# Patient Record
Sex: Male | Born: 1965 | Race: White | Hispanic: No | Smoking: Current every day smoker
Health system: Southern US, Community
[De-identification: ages and names within clinical notes are randomized; demographics above are authoritative.]

## PROBLEM LIST (undated history)

## (undated) DIAGNOSIS — K5090 Crohn's disease, unspecified, without complications: Secondary | ICD-10-CM

## (undated) DIAGNOSIS — I639 Cerebral infarction, unspecified: Secondary | ICD-10-CM

## (undated) DIAGNOSIS — C20 Malignant neoplasm of rectum: Secondary | ICD-10-CM

## (undated) DIAGNOSIS — L409 Psoriasis, unspecified: Secondary | ICD-10-CM

## (undated) HISTORY — DX: Psoriasis, unspecified: L40.9

## (undated) HISTORY — PX: OTHER SURGICAL HISTORY: SHX169

## (undated) HISTORY — PX: KNEE SURGERY: SHX244

## (undated) HISTORY — DX: Malignant neoplasm of rectum: C20

## (undated) HISTORY — DX: Crohn's disease, unspecified, without complications: K50.90

## (undated) HISTORY — DX: Cerebral infarction, unspecified: I63.9

---

## 2004-04-24 ENCOUNTER — Ambulatory Visit: Payer: Self-pay | Admitting: Internal Medicine

## 2004-06-19 ENCOUNTER — Ambulatory Visit: Payer: Self-pay | Admitting: Internal Medicine

## 2008-08-29 ENCOUNTER — Ambulatory Visit: Payer: Self-pay | Admitting: Cardiology

## 2008-09-26 ENCOUNTER — Ambulatory Visit: Payer: Self-pay | Admitting: Cardiology

## 2009-03-31 ENCOUNTER — Ambulatory Visit (HOSPITAL_COMMUNITY): Admission: RE | Admit: 2009-03-31 | Discharge: 2009-03-31 | Payer: Self-pay | Admitting: Cardiovascular Disease

## 2011-05-16 DIAGNOSIS — J0100 Acute maxillary sinusitis, unspecified: Secondary | ICD-10-CM | POA: Diagnosis not present

## 2011-05-16 DIAGNOSIS — J209 Acute bronchitis, unspecified: Secondary | ICD-10-CM | POA: Diagnosis not present

## 2011-06-04 DIAGNOSIS — J209 Acute bronchitis, unspecified: Secondary | ICD-10-CM | POA: Diagnosis not present

## 2011-08-30 DIAGNOSIS — J209 Acute bronchitis, unspecified: Secondary | ICD-10-CM | POA: Diagnosis not present

## 2011-08-30 DIAGNOSIS — L723 Sebaceous cyst: Secondary | ICD-10-CM | POA: Diagnosis not present

## 2012-01-16 DIAGNOSIS — L408 Other psoriasis: Secondary | ICD-10-CM | POA: Diagnosis not present

## 2012-02-20 DIAGNOSIS — L408 Other psoriasis: Secondary | ICD-10-CM | POA: Diagnosis not present

## 2012-03-26 DIAGNOSIS — R1032 Left lower quadrant pain: Secondary | ICD-10-CM | POA: Diagnosis not present

## 2012-03-26 DIAGNOSIS — J0100 Acute maxillary sinusitis, unspecified: Secondary | ICD-10-CM | POA: Diagnosis not present

## 2012-08-19 DIAGNOSIS — I10 Essential (primary) hypertension: Secondary | ICD-10-CM | POA: Diagnosis not present

## 2012-08-19 DIAGNOSIS — L02519 Cutaneous abscess of unspecified hand: Secondary | ICD-10-CM | POA: Diagnosis not present

## 2012-08-19 DIAGNOSIS — Z79899 Other long term (current) drug therapy: Secondary | ICD-10-CM | POA: Diagnosis not present

## 2012-08-19 DIAGNOSIS — L03119 Cellulitis of unspecified part of limb: Secondary | ICD-10-CM | POA: Diagnosis not present

## 2012-08-19 DIAGNOSIS — Z0000 Encounter for general adult medical examination without abnormal findings: Secondary | ICD-10-CM | POA: Diagnosis not present

## 2012-08-19 DIAGNOSIS — E7800 Pure hypercholesterolemia, unspecified: Secondary | ICD-10-CM | POA: Diagnosis not present

## 2012-08-19 DIAGNOSIS — N400 Enlarged prostate without lower urinary tract symptoms: Secondary | ICD-10-CM | POA: Diagnosis not present

## 2012-08-19 DIAGNOSIS — L723 Sebaceous cyst: Secondary | ICD-10-CM | POA: Diagnosis not present

## 2012-08-20 DIAGNOSIS — R300 Dysuria: Secondary | ICD-10-CM | POA: Diagnosis not present

## 2012-09-09 DIAGNOSIS — M5430 Sciatica, unspecified side: Secondary | ICD-10-CM | POA: Diagnosis not present

## 2012-12-18 DIAGNOSIS — E7800 Pure hypercholesterolemia, unspecified: Secondary | ICD-10-CM | POA: Diagnosis not present

## 2012-12-18 DIAGNOSIS — I10 Essential (primary) hypertension: Secondary | ICD-10-CM | POA: Diagnosis not present

## 2012-12-18 DIAGNOSIS — N400 Enlarged prostate without lower urinary tract symptoms: Secondary | ICD-10-CM | POA: Diagnosis not present

## 2012-12-18 DIAGNOSIS — L723 Sebaceous cyst: Secondary | ICD-10-CM | POA: Diagnosis not present

## 2012-12-30 DIAGNOSIS — R300 Dysuria: Secondary | ICD-10-CM | POA: Diagnosis not present

## 2012-12-30 DIAGNOSIS — L723 Sebaceous cyst: Secondary | ICD-10-CM | POA: Diagnosis not present

## 2012-12-30 DIAGNOSIS — R351 Nocturia: Secondary | ICD-10-CM | POA: Diagnosis not present

## 2012-12-30 DIAGNOSIS — R39198 Other difficulties with micturition: Secondary | ICD-10-CM | POA: Diagnosis not present

## 2013-01-14 DIAGNOSIS — Z79899 Other long term (current) drug therapy: Secondary | ICD-10-CM | POA: Diagnosis not present

## 2013-01-14 DIAGNOSIS — F17200 Nicotine dependence, unspecified, uncomplicated: Secondary | ICD-10-CM | POA: Diagnosis not present

## 2013-01-14 DIAGNOSIS — R351 Nocturia: Secondary | ICD-10-CM | POA: Diagnosis not present

## 2013-01-14 DIAGNOSIS — R300 Dysuria: Secondary | ICD-10-CM | POA: Diagnosis not present

## 2013-01-14 DIAGNOSIS — Z883 Allergy status to other anti-infective agents status: Secondary | ICD-10-CM | POA: Diagnosis not present

## 2013-01-14 DIAGNOSIS — M4120 Other idiopathic scoliosis, site unspecified: Secondary | ICD-10-CM | POA: Diagnosis not present

## 2013-01-14 DIAGNOSIS — R39198 Other difficulties with micturition: Secondary | ICD-10-CM | POA: Diagnosis not present

## 2013-01-14 DIAGNOSIS — Z85038 Personal history of other malignant neoplasm of large intestine: Secondary | ICD-10-CM | POA: Diagnosis not present

## 2013-01-14 DIAGNOSIS — N508 Other specified disorders of male genital organs: Secondary | ICD-10-CM | POA: Diagnosis not present

## 2013-01-14 DIAGNOSIS — L723 Sebaceous cyst: Secondary | ICD-10-CM | POA: Diagnosis not present

## 2013-01-14 DIAGNOSIS — I10 Essential (primary) hypertension: Secondary | ICD-10-CM | POA: Diagnosis not present

## 2013-01-14 DIAGNOSIS — K5900 Constipation, unspecified: Secondary | ICD-10-CM | POA: Diagnosis not present

## 2013-01-14 DIAGNOSIS — L408 Other psoriasis: Secondary | ICD-10-CM | POA: Diagnosis not present

## 2013-01-14 DIAGNOSIS — K219 Gastro-esophageal reflux disease without esophagitis: Secondary | ICD-10-CM | POA: Diagnosis not present

## 2013-01-14 DIAGNOSIS — M069 Rheumatoid arthritis, unspecified: Secondary | ICD-10-CM | POA: Diagnosis not present

## 2013-01-20 DIAGNOSIS — R39198 Other difficulties with micturition: Secondary | ICD-10-CM | POA: Diagnosis not present

## 2013-01-20 DIAGNOSIS — N3943 Post-void dribbling: Secondary | ICD-10-CM | POA: Diagnosis not present

## 2013-01-20 DIAGNOSIS — R351 Nocturia: Secondary | ICD-10-CM | POA: Diagnosis not present

## 2013-01-20 DIAGNOSIS — R300 Dysuria: Secondary | ICD-10-CM | POA: Diagnosis not present

## 2013-01-20 DIAGNOSIS — L723 Sebaceous cyst: Secondary | ICD-10-CM | POA: Diagnosis not present

## 2013-02-12 DIAGNOSIS — J0100 Acute maxillary sinusitis, unspecified: Secondary | ICD-10-CM | POA: Diagnosis not present

## 2013-02-12 DIAGNOSIS — J209 Acute bronchitis, unspecified: Secondary | ICD-10-CM | POA: Diagnosis not present

## 2013-02-23 DIAGNOSIS — L723 Sebaceous cyst: Secondary | ICD-10-CM | POA: Diagnosis not present

## 2013-02-23 DIAGNOSIS — R39198 Other difficulties with micturition: Secondary | ICD-10-CM | POA: Diagnosis not present

## 2013-02-23 DIAGNOSIS — R351 Nocturia: Secondary | ICD-10-CM | POA: Diagnosis not present

## 2013-02-23 DIAGNOSIS — N3943 Post-void dribbling: Secondary | ICD-10-CM | POA: Diagnosis not present

## 2013-04-13 DIAGNOSIS — E7800 Pure hypercholesterolemia, unspecified: Secondary | ICD-10-CM | POA: Diagnosis not present

## 2013-04-13 DIAGNOSIS — I10 Essential (primary) hypertension: Secondary | ICD-10-CM | POA: Diagnosis not present

## 2013-04-14 DIAGNOSIS — E7800 Pure hypercholesterolemia, unspecified: Secondary | ICD-10-CM | POA: Diagnosis not present

## 2013-04-20 DIAGNOSIS — M5430 Sciatica, unspecified side: Secondary | ICD-10-CM | POA: Diagnosis not present

## 2013-04-20 DIAGNOSIS — N400 Enlarged prostate without lower urinary tract symptoms: Secondary | ICD-10-CM | POA: Diagnosis not present

## 2013-04-20 DIAGNOSIS — E7800 Pure hypercholesterolemia, unspecified: Secondary | ICD-10-CM | POA: Diagnosis not present

## 2013-04-20 DIAGNOSIS — D490 Neoplasm of unspecified behavior of digestive system: Secondary | ICD-10-CM | POA: Diagnosis not present

## 2013-04-20 DIAGNOSIS — I10 Essential (primary) hypertension: Secondary | ICD-10-CM | POA: Diagnosis not present

## 2013-04-28 DIAGNOSIS — L989 Disorder of the skin and subcutaneous tissue, unspecified: Secondary | ICD-10-CM | POA: Diagnosis not present

## 2013-04-28 DIAGNOSIS — K130 Diseases of lips: Secondary | ICD-10-CM | POA: Diagnosis not present

## 2013-05-22 DIAGNOSIS — T887XXA Unspecified adverse effect of drug or medicament, initial encounter: Secondary | ICD-10-CM | POA: Diagnosis not present

## 2013-06-16 DIAGNOSIS — L089 Local infection of the skin and subcutaneous tissue, unspecified: Secondary | ICD-10-CM | POA: Diagnosis not present

## 2013-06-16 DIAGNOSIS — L723 Sebaceous cyst: Secondary | ICD-10-CM | POA: Diagnosis not present

## 2013-06-16 DIAGNOSIS — L989 Disorder of the skin and subcutaneous tissue, unspecified: Secondary | ICD-10-CM | POA: Diagnosis not present

## 2013-07-02 DIAGNOSIS — L723 Sebaceous cyst: Secondary | ICD-10-CM | POA: Diagnosis not present

## 2013-07-16 DIAGNOSIS — L408 Other psoriasis: Secondary | ICD-10-CM | POA: Diagnosis not present

## 2013-07-16 DIAGNOSIS — L723 Sebaceous cyst: Secondary | ICD-10-CM | POA: Diagnosis not present

## 2013-08-24 DIAGNOSIS — S30860A Insect bite (nonvenomous) of lower back and pelvis, initial encounter: Secondary | ICD-10-CM | POA: Diagnosis not present

## 2013-08-24 DIAGNOSIS — W57XXXA Bitten or stung by nonvenomous insect and other nonvenomous arthropods, initial encounter: Secondary | ICD-10-CM | POA: Diagnosis not present

## 2013-08-24 DIAGNOSIS — L408 Other psoriasis: Secondary | ICD-10-CM | POA: Diagnosis not present

## 2013-10-20 DIAGNOSIS — I10 Essential (primary) hypertension: Secondary | ICD-10-CM | POA: Diagnosis not present

## 2013-10-20 DIAGNOSIS — E7800 Pure hypercholesterolemia, unspecified: Secondary | ICD-10-CM | POA: Diagnosis not present

## 2013-12-01 DIAGNOSIS — I10 Essential (primary) hypertension: Secondary | ICD-10-CM | POA: Diagnosis not present

## 2013-12-01 DIAGNOSIS — N400 Enlarged prostate without lower urinary tract symptoms: Secondary | ICD-10-CM | POA: Diagnosis not present

## 2013-12-01 DIAGNOSIS — K2100 Gastro-esophageal reflux disease with esophagitis, without bleeding: Secondary | ICD-10-CM | POA: Diagnosis not present

## 2013-12-01 DIAGNOSIS — L408 Other psoriasis: Secondary | ICD-10-CM | POA: Diagnosis not present

## 2013-12-01 DIAGNOSIS — M5430 Sciatica, unspecified side: Secondary | ICD-10-CM | POA: Diagnosis not present

## 2013-12-01 DIAGNOSIS — E7800 Pure hypercholesterolemia, unspecified: Secondary | ICD-10-CM | POA: Diagnosis not present

## 2013-12-27 DIAGNOSIS — L408 Other psoriasis: Secondary | ICD-10-CM | POA: Diagnosis not present

## 2013-12-27 DIAGNOSIS — R39198 Other difficulties with micturition: Secondary | ICD-10-CM | POA: Diagnosis not present

## 2013-12-27 DIAGNOSIS — L723 Sebaceous cyst: Secondary | ICD-10-CM | POA: Diagnosis not present

## 2014-01-03 DIAGNOSIS — Z79899 Other long term (current) drug therapy: Secondary | ICD-10-CM | POA: Diagnosis not present

## 2014-01-03 DIAGNOSIS — L408 Other psoriasis: Secondary | ICD-10-CM | POA: Diagnosis not present

## 2014-01-03 DIAGNOSIS — L0100 Impetigo, unspecified: Secondary | ICD-10-CM | POA: Diagnosis not present

## 2014-03-21 DIAGNOSIS — J209 Acute bronchitis, unspecified: Secondary | ICD-10-CM | POA: Diagnosis not present

## 2014-03-21 DIAGNOSIS — J0101 Acute recurrent maxillary sinusitis: Secondary | ICD-10-CM | POA: Diagnosis not present

## 2014-03-30 DIAGNOSIS — I10 Essential (primary) hypertension: Secondary | ICD-10-CM | POA: Diagnosis not present

## 2014-03-30 DIAGNOSIS — E780 Pure hypercholesterolemia: Secondary | ICD-10-CM | POA: Diagnosis not present

## 2014-03-31 DIAGNOSIS — J209 Acute bronchitis, unspecified: Secondary | ICD-10-CM | POA: Diagnosis not present

## 2014-03-31 DIAGNOSIS — I10 Essential (primary) hypertension: Secondary | ICD-10-CM | POA: Diagnosis not present

## 2014-03-31 DIAGNOSIS — E780 Pure hypercholesterolemia: Secondary | ICD-10-CM | POA: Diagnosis not present

## 2014-08-01 DIAGNOSIS — L723 Sebaceous cyst: Secondary | ICD-10-CM | POA: Diagnosis not present

## 2014-08-01 DIAGNOSIS — L291 Pruritus scroti: Secondary | ICD-10-CM | POA: Diagnosis not present

## 2014-08-01 DIAGNOSIS — R3919 Other difficulties with micturition: Secondary | ICD-10-CM | POA: Diagnosis not present

## 2014-09-22 DIAGNOSIS — M791 Myalgia: Secondary | ICD-10-CM | POA: Diagnosis not present

## 2014-09-22 DIAGNOSIS — I10 Essential (primary) hypertension: Secondary | ICD-10-CM | POA: Diagnosis not present

## 2014-09-22 DIAGNOSIS — L409 Psoriasis, unspecified: Secondary | ICD-10-CM | POA: Diagnosis not present

## 2014-09-22 DIAGNOSIS — Z1389 Encounter for screening for other disorder: Secondary | ICD-10-CM | POA: Diagnosis not present

## 2014-09-22 DIAGNOSIS — E780 Pure hypercholesterolemia: Secondary | ICD-10-CM | POA: Diagnosis not present

## 2014-10-27 DIAGNOSIS — E780 Pure hypercholesterolemia: Secondary | ICD-10-CM | POA: Diagnosis not present

## 2014-10-27 DIAGNOSIS — I10 Essential (primary) hypertension: Secondary | ICD-10-CM | POA: Diagnosis not present

## 2014-11-02 DIAGNOSIS — Z720 Tobacco use: Secondary | ICD-10-CM | POA: Diagnosis not present

## 2014-11-02 DIAGNOSIS — E780 Pure hypercholesterolemia: Secondary | ICD-10-CM | POA: Diagnosis not present

## 2014-11-02 DIAGNOSIS — I10 Essential (primary) hypertension: Secondary | ICD-10-CM | POA: Diagnosis not present

## 2014-11-02 DIAGNOSIS — L409 Psoriasis, unspecified: Secondary | ICD-10-CM | POA: Diagnosis not present

## 2014-11-02 DIAGNOSIS — M791 Myalgia: Secondary | ICD-10-CM | POA: Diagnosis not present

## 2014-11-02 DIAGNOSIS — R7301 Impaired fasting glucose: Secondary | ICD-10-CM | POA: Diagnosis not present

## 2015-02-15 DIAGNOSIS — E7800 Pure hypercholesterolemia, unspecified: Secondary | ICD-10-CM | POA: Diagnosis not present

## 2015-02-15 DIAGNOSIS — I10 Essential (primary) hypertension: Secondary | ICD-10-CM | POA: Diagnosis not present

## 2015-02-22 DIAGNOSIS — I10 Essential (primary) hypertension: Secondary | ICD-10-CM | POA: Diagnosis not present

## 2015-02-22 DIAGNOSIS — M791 Myalgia: Secondary | ICD-10-CM | POA: Diagnosis not present

## 2015-02-22 DIAGNOSIS — L409 Psoriasis, unspecified: Secondary | ICD-10-CM | POA: Diagnosis not present

## 2015-02-22 DIAGNOSIS — R7301 Impaired fasting glucose: Secondary | ICD-10-CM | POA: Diagnosis not present

## 2015-03-28 DIAGNOSIS — L049 Acute lymphadenitis, unspecified: Secondary | ICD-10-CM | POA: Diagnosis not present

## 2015-05-19 DIAGNOSIS — J209 Acute bronchitis, unspecified: Secondary | ICD-10-CM | POA: Diagnosis not present

## 2015-05-19 DIAGNOSIS — J0101 Acute recurrent maxillary sinusitis: Secondary | ICD-10-CM | POA: Diagnosis not present

## 2015-06-02 DIAGNOSIS — J0101 Acute recurrent maxillary sinusitis: Secondary | ICD-10-CM | POA: Diagnosis not present

## 2015-06-02 DIAGNOSIS — E039 Hypothyroidism, unspecified: Secondary | ICD-10-CM | POA: Diagnosis not present

## 2015-06-02 DIAGNOSIS — J209 Acute bronchitis, unspecified: Secondary | ICD-10-CM | POA: Diagnosis not present

## 2015-06-02 DIAGNOSIS — R42 Dizziness and giddiness: Secondary | ICD-10-CM | POA: Diagnosis not present

## 2015-06-07 DIAGNOSIS — I638 Other cerebral infarction: Secondary | ICD-10-CM | POA: Diagnosis not present

## 2015-06-07 DIAGNOSIS — R509 Fever, unspecified: Secondary | ICD-10-CM | POA: Diagnosis not present

## 2015-06-07 DIAGNOSIS — R112 Nausea with vomiting, unspecified: Secondary | ICD-10-CM | POA: Diagnosis not present

## 2015-06-07 DIAGNOSIS — H538 Other visual disturbances: Secondary | ICD-10-CM | POA: Diagnosis not present

## 2015-06-07 DIAGNOSIS — R05 Cough: Secondary | ICD-10-CM | POA: Diagnosis not present

## 2015-06-09 DIAGNOSIS — R42 Dizziness and giddiness: Secondary | ICD-10-CM | POA: Diagnosis not present

## 2015-06-09 DIAGNOSIS — I69398 Other sequelae of cerebral infarction: Secondary | ICD-10-CM | POA: Diagnosis not present

## 2015-06-12 DIAGNOSIS — R42 Dizziness and giddiness: Secondary | ICD-10-CM | POA: Diagnosis not present

## 2015-06-12 DIAGNOSIS — I639 Cerebral infarction, unspecified: Secondary | ICD-10-CM | POA: Diagnosis not present

## 2015-06-13 ENCOUNTER — Ambulatory Visit (INDEPENDENT_AMBULATORY_CARE_PROVIDER_SITE_OTHER): Payer: Medicare Other | Admitting: Neurology

## 2015-06-13 ENCOUNTER — Encounter: Payer: Self-pay | Admitting: Neurology

## 2015-06-13 VITALS — BP 163/102 | HR 70 | Ht 68.0 in | Wt 162.0 lb

## 2015-06-13 DIAGNOSIS — I639 Cerebral infarction, unspecified: Secondary | ICD-10-CM | POA: Diagnosis not present

## 2015-06-13 DIAGNOSIS — R799 Abnormal finding of blood chemistry, unspecified: Secondary | ICD-10-CM | POA: Diagnosis not present

## 2015-06-13 DIAGNOSIS — R42 Dizziness and giddiness: Secondary | ICD-10-CM | POA: Diagnosis not present

## 2015-06-13 DIAGNOSIS — I10 Essential (primary) hypertension: Secondary | ICD-10-CM | POA: Diagnosis not present

## 2015-06-13 MED ORDER — AMLODIPINE BESYLATE 5 MG PO TABS: 5.0000 mg | ORAL_TABLET | Freq: Every day | ORAL | Status: DC

## 2015-06-13 NOTE — Progress Notes (Signed)
PATIENT: Michael Levine DOB: 06/10/1965  Chief Complaint  Patient presents with  . Cerebrovascular Accident    He is here with his mother, Romie Minus.  He went to his primary care doctor for intermittent dizzy spells.  He underwent scanning and multiple strokes were discovered.  He is still having left-sided weakness, mild slurred speech and headaches.     HISTORICAL  Michael Levine is a 50 years old right-handed male, accompanied by his mother [name], seen in refer by his primary care physician Dr. Consuello Masse for evaluation of stroke  He had a history of Crohn's disease, it is currently under good control, rectal cancer in 1995, he had recurrent rectal cancer, required multiple surgery, followed by chemotherapy and radiation therapy, psoriasis  In May 07 2015, he had onset vertigo, unsteady gait lasted for 30 minutes, went he went to baseline quickly, in December 30 first 2016, he had second episode of sudden onset dizziness, this time is prolonged lasting for a few days with associated nausea or vomiting, unsteady gait, blurry vision, he has severe nausea to the point of difficulty holding down the water, eventually was evaluated by his primary care physician Dr. Quintin Alto.  I reviewed MRI of the brain without contrast in June 07 2015 at [facility] diagnostic imaging, there is a mix of subacute and chronic infarction in the right cerebellum, suggesting right vertebral artery thromboembolic disease, no associated hemorrhagic or mass effect,   Laboratory evaluation June 02 2015, normal CMP, creatinine 0.9, normal CBC, hemoglobin 16 point 1, normal TSH,  CT angiogram of neck in June 12 2015, widely patent bilateral carotid, vertebral artery without evidence of significant stenosis   Over the past few weeks, he had marked recovery, but continue feel intermittent vertigo, dizziness, clumsiness of both hands, especially left arm, mild unsteady gait, intermittent chest discomfort, pressure  sensation, no palpitation noticed   REVIEW OF SYSTEMS: Full 14 system review of systems performed and notable only for chest pain, blurred vision, double vision, loss of vision, eye pain, cough, joint pain, cramps, achy muscles, headaches, weakness, slurred speech, dizziness, sleepiness, disinterested in activities ALLERGIES: Allergies  Allergen Reactions  . Keflex [Cephalexin] Anaphylaxis    HOME MEDICATIONS: Current Outpatient Prescriptions  Medication Sig Dispense Refill  . Acetaminophen (TYLENOL PO) Take by mouth as needed.    Marland Kitchen aspirin 81 MG tablet Take 81 mg by mouth daily.    . clobetasol (TEMOVATE) 0.05 % external solution     . omeprazole (PRILOSEC) 20 MG capsule     . PROCTO-MED HC 2.5 % rectal cream     . ranitidine (ZANTAC) 150 MG tablet      No current facility-administered medications for this visit.    PAST MEDICAL HISTORY: Past Medical History  Diagnosis Date  . Crohn's disease ([place])   . Rectum cancer ([place])   . Psoriasis   . CVA (cerebral infarction)     PAST SURGICAL HISTORY: Past Surgical History  Procedure Laterality Date  . Rectum surgery      x 3  . Knee surgery Right     FAMILY HISTORY: Family History  Problem Relation Age of Onset  . Heart disease Father   . Diabetes Father   . Hypertension Mother   . Fibromyalgia Mother     SOCIAL HISTORY:  Social History   Social History  . Marital Status: Divorced    Spouse Name: N/A  . Number of Children: 2  . Years of Education: 9th grade  Occupational History  . Disabled    Social History Main Topics  . Smoking status: Current Every Day Smoker -- 5.00 packs/day    Types: Cigarettes  . Smokeless tobacco: Not on file  . Alcohol Use: No  . Drug Use: No  . Sexual Activity: Not on file   Other Topics Concern  . Not on file   Social History Narrative   Lives at home alone.   Right-handed.   8-10 sodas per day.     PHYSICAL EXAM   Filed Vitals:   06/13/15 1313  BP: 163/102    Pulse: 70  Height: 5\' 8"  (1.727 m)  Weight: 162 lb (73.483 kg)    Not recorded      Body mass index is 24.64 kg/(m^2).  PHYSICAL EXAMNIATION:  Gen: NAD, conversant, well nourised, obese, well groomed                     Cardiovascular: Regular rate rhythm, no peripheral edema, warm, nontender. Eyes: Conjunctivae clear without exudates or hemorrhage Neck: Supple, no carotid bruise. Pulmonary: Clear to auscultation bilaterally   NEUROLOGICAL EXAM:  MENTAL STATUS: Speech:    Speech is normal; fluent and spontaneous with normal comprehension.  Cognition:     Orientation to time, place and person     Normal recent and remote memory     Normal Attention span and concentration     Normal Language, naming, repeating,spontaneous speech     Fund of knowledge   CRANIAL NERVES: CN II: Visual fields are full to confrontation. Fundoscopic exam is normal with sharp discs and no vascular changes. Pupils are round equal and briskly reactive to light. CN III, IV, VI: extraocular movement are normal. No ptosis. CN V: Facial sensation is intact to pinprick in all 3 divisions bilaterally. Corneal responses are intact.  CN VII: Face is symmetric with normal eye closure and smile. CN VIII: Hearing is normal to rubbing fingers CN IX, X: Palate elevates symmetrically. Phonation is normal. CN XI: Head turning and shoulder shrug are intact CN XII: Tongue is midline with normal movements and no atrophy.  MOTOR: There is no pronator drift of out-stretched arms. Muscle bulk and tone are normal. Mild fixation of left upper extremity upon rapid rotating movement  REFLEXES: Reflexes are 2+ and symmetric at the biceps, triceps, knees, and ankles. Plantar responses are flexor.  SENSORY: Intact to light touch, pinprick, position sense, and vibration sense are intact in fingers and toes.  COORDINATION: Rapid alternating movements and fine finger movements are intact. He has mild left finger to nose  dysmetria  GAIT/STANCE: Posture is normal. Gait is steady with normal steps, base, arm swing, and turning. Heel and toe walking are normal. Mild difficulty with tandem walking Romberg is absent.   DIAGNOSTIC DATA (LABS, IMAGING, TESTING) - I reviewed patient records, labs, notes, testing and imaging myself where available.   ASSESSMENT AND PLAN  RAWLAND SERINO is a 50 y.o. male    50 years old with history of acute stroke at right cerebellum in December 2016.  He has vascular risk factor of HTN, today's bp is elevated, will add on amlodipine 5 mg daily  Daily aspirin keep well hydration moderate exercise  Complete evaluation with echocardiogram, EKG  Laboratory evaluation today including inflammatory markers, hypocoagulable status,   Marcial Pacas, M.D. Ph.D.  [facility] [address], [place], [place] [address] Ph: [phone] Fax: [phone]  [id number] Hilda Blades, MD

## 2015-06-21 ENCOUNTER — Telehealth: Payer: Self-pay | Admitting: Neurology

## 2015-06-21 ENCOUNTER — Encounter: Payer: Self-pay | Admitting: *Deleted

## 2015-06-21 DIAGNOSIS — R7301 Impaired fasting glucose: Secondary | ICD-10-CM | POA: Diagnosis not present

## 2015-06-21 DIAGNOSIS — E7800 Pure hypercholesterolemia, unspecified: Secondary | ICD-10-CM | POA: Diagnosis not present

## 2015-06-21 DIAGNOSIS — I10 Essential (primary) hypertension: Secondary | ICD-10-CM | POA: Diagnosis not present

## 2015-06-21 LAB — HYPERCOAGULABLE PANEL, COMPREHENSIVE
ACT. PRT C RESIST W/FV DEFIC.: 2.7 ratio
APTT: 29.7 s
AT III ACT/NOR PPP CHRO: 133 %
Anticardiolipin Ab, IgG: <10 [GPL'U]
Beta-2 Glycoprotein I, IgG: <10 SGU
DRVVT Confirm Seconds: 36 s
DRVVT RATIO: 1.4 ratio — AB
DRVVT Screen Seconds: 56.3 s — ABNORMAL HIGH
FACTOR VIII ACTIVITY: 124 %
Factor VII Antigen**: 98 %
HOMOCYSTEINE: 17.2 umol/L — AB
Hexagonal Phospholipid Neutral: 12 s — ABNORMAL HIGH
PROTEIN C AG/FVII AG RATIO: 1 ratio
Prot C Ag Act/Nor PPP Imm: 99 %
Prot S Ag Act/Nor PPP Imm: 132 %
Protein S Ag/FVII Ag Ratio**: 1.3 ratio

## 2015-06-21 LAB — C-REACTIVE PROTEIN: CRP: 20 mg/L — ABNORMAL HIGH (ref 0.0–4.9)

## 2015-06-21 LAB — SEDIMENTATION RATE: Sed Rate: 11 mm/hr (ref 0–30)

## 2015-06-21 LAB — VITAMIN B12: Vitamin B-12: 218 pg/mL (ref 211–946)

## 2015-06-21 LAB — CK: Total CK: 86 U/L (ref 24–204)

## 2015-06-21 LAB — RPR: RPR Ser Ql: NONREACTIVE

## 2015-06-21 LAB — FOLATE: Folate: 6.5 ng/mL (ref >3.0)

## 2015-06-21 LAB — ANA W/REFLEX IF POSITIVE: ANA: NEGATIVE

## 2015-06-21 NOTE — Telephone Encounter (Signed)
Patient aware of results and agreeable to supplements.  He wrote down the information to make sure he did not forget the dosages.

## 2015-06-21 NOTE — Telephone Encounter (Signed)
Please call patient, laboratory evaluation showed low normal B12, elevated homocystine level, elevated C reactive protein  He should take over-the-counter B12 supplement, [id number] g daily, folic acid 1 mg daily, keep daily aspirin

## 2015-06-27 ENCOUNTER — Ambulatory Visit (HOSPITAL_COMMUNITY): Payer: Medicare Other | Attending: Cardiology

## 2015-06-27 ENCOUNTER — Other Ambulatory Visit: Payer: Self-pay

## 2015-06-27 DIAGNOSIS — R42 Dizziness and giddiness: Secondary | ICD-10-CM | POA: Diagnosis not present

## 2015-06-27 DIAGNOSIS — I639 Cerebral infarction, unspecified: Secondary | ICD-10-CM | POA: Insufficient documentation

## 2015-06-27 DIAGNOSIS — I071 Rheumatic tricuspid insufficiency: Secondary | ICD-10-CM | POA: Diagnosis not present

## 2015-06-27 DIAGNOSIS — I340 Nonrheumatic mitral (valve) insufficiency: Secondary | ICD-10-CM | POA: Diagnosis not present

## 2015-06-27 DIAGNOSIS — I5189 Other ill-defined heart diseases: Secondary | ICD-10-CM | POA: Insufficient documentation

## 2015-06-28 ENCOUNTER — Telehealth: Payer: Self-pay | Admitting: Neurology

## 2015-06-28 NOTE — Telephone Encounter (Signed)
Please call patient Echocardiogram showed - Mild to moderate global reduction in LV function (EF 45); grade 1 diastolic dysfunction; trace MR; mild TR.  Keep current treatment plan

## 2015-06-28 NOTE — Telephone Encounter (Signed)
Patient aware of results.  He will continue his treatment plan and keep his follow up appointment.

## 2015-07-03 DIAGNOSIS — R21 Rash and other nonspecific skin eruption: Secondary | ICD-10-CM | POA: Diagnosis not present

## 2015-07-03 DIAGNOSIS — I10 Essential (primary) hypertension: Secondary | ICD-10-CM | POA: Diagnosis not present

## 2015-07-03 DIAGNOSIS — M791 Myalgia: Secondary | ICD-10-CM | POA: Diagnosis not present

## 2015-07-03 DIAGNOSIS — L409 Psoriasis, unspecified: Secondary | ICD-10-CM | POA: Diagnosis not present

## 2015-07-03 DIAGNOSIS — R7301 Impaired fasting glucose: Secondary | ICD-10-CM | POA: Diagnosis not present

## 2015-07-03 DIAGNOSIS — I639 Cerebral infarction, unspecified: Secondary | ICD-10-CM | POA: Diagnosis not present

## 2015-08-01 DIAGNOSIS — L723 Sebaceous cyst: Secondary | ICD-10-CM | POA: Diagnosis not present

## 2015-08-01 DIAGNOSIS — N319 Neuromuscular dysfunction of bladder, unspecified: Secondary | ICD-10-CM | POA: Diagnosis not present

## 2015-08-14 ENCOUNTER — Ambulatory Visit (INDEPENDENT_AMBULATORY_CARE_PROVIDER_SITE_OTHER): Payer: Medicare Other | Admitting: Neurology

## 2015-08-14 ENCOUNTER — Encounter: Payer: Self-pay | Admitting: Neurology

## 2015-08-14 VITALS — BP 151/89 | HR 75 | Ht 68.0 in | Wt 161.2 lb

## 2015-08-14 DIAGNOSIS — I639 Cerebral infarction, unspecified: Secondary | ICD-10-CM

## 2015-08-14 DIAGNOSIS — I10 Essential (primary) hypertension: Secondary | ICD-10-CM

## 2015-08-14 DIAGNOSIS — R9430 Abnormal result of cardiovascular function study, unspecified: Secondary | ICD-10-CM

## 2015-08-14 MED ORDER — DULOXETINE HCL 60 MG PO CPEP: 60.0000 mg | ORAL_CAPSULE | Freq: Every day | ORAL | Status: AC

## 2015-08-14 NOTE — Progress Notes (Signed)
Chief Complaint  Patient presents with  . Cerebrovascular Accident    He would like to review his Echo and labs.  Says he is still having upper extremity weakness but feels his symptoms have been improving slowly.        PATIENT: Michael Levine DOB: 1965/06/29  Chief Complaint  Patient presents with  . Cerebrovascular Accident    He would like to review his Echo and labs.  Says he is still having upper extremity weakness but feels his symptoms have been improving slowly.       HISTORICAL  Michael Levine is a 50 years old right-handed male, accompanied by his mother Michael Levine, seen in refer by his primary care physician Dr. Consuello Masse for evaluation of stroke in Jun 13 2015.  He had a history of Crohn's disease, it is currently under good control, rectal cancer in 1995, he had recurrent rectal cancer, required multiple surgery, followed by chemotherapy and radiation therapy, psoriasis  In May 07 2015, he had onset vertigo, unsteady gait lasted for 30 minutes, then he went back to baseline quickly, in December 31st 2016, he had second episode of sudden onset dizziness, this time is prolonged, lasting for a few days with associated nausea or vomiting, unsteady gait, blurry vision, he has severe nausea to the point of difficulty holding down the water, eventually was evaluated by his primary care physician Dr. Quintin Alto.  I reviewed MRI of the brain without contrast in June 07 2015 at [facility] diagnostic imaging, there is a mix of subacute and chronic infarction in the right cerebellum, suggesting right vertebral artery thromboembolic disease, no associated hemorrhagic or mass effect,   Laboratory evaluation June 02 2015, normal CMP, creatinine 0.9, normal CBC, hemoglobin 16 point 1, normal TSH,  CT angiogram of neck in June 12 2015, widely patent bilateral carotid, vertebral artery without evidence of significant stenosis   Over the past few weeks, he had marked recovery, but  continue feel intermittent vertigo, dizziness, clumsiness of both hands, especially left arm, mild unsteady gait, intermittent chest discomfort, pressure sensation, no palpitation noticed  UPDATE August 14 2015: He has no recurrent strokelike symptoms, still has mild unsteady gait, bilateral hands clumsiness, echocardiogram in February showed ejection fraction 40-45%, diffuse hypokinesia is, he denied history of chronic artery disease, denied chest pain, he has been taking aspirin daily,  Laboratory evaluation showed elevated C reactive protein of 20, otherwise normal ESR, low normal B12 218, mild elevated homocystine level 12.2, normal CPK, TSH,  he has been taking B12 supplement, no significant abnormality on hypercoagulable profile.  He complains of worsening psoriasis skin lesions, which involving bilateral thighs, he also complains of generalized muscle achy pain, joints pain, difficulty raising arm overhead,  She has significant neck pain, radiating pain to bilateral shoulder  REVIEW OF SYSTEMS: Full 14 system review of systems performed and notable only for blurred vision, wheezing, restless leg, joint pain, achy muscles, muscle cramps, neck pain, neck stiffness, dizziness, numbness, speech difficulty, weakness   Allergies  Allergen Reactions  . Keflex [Cephalexin] Anaphylaxis  . Augmentin [Amoxicillin-Pot Clavulanate] Other (See Comments)    diarrhea    HOME MEDICATIONS: Current Outpatient Prescriptions  Medication Sig Dispense Refill  . amLODipine (NORVASC) 5 MG tablet Take 1 tablet (5 mg total) by mouth daily. 30 tablet 11  . aspirin 81 MG tablet Take 81 mg by mouth daily.    Marland Kitchen atorvastatin (LIPITOR) 10 MG tablet     . clobetasol (TEMOVATE) 0.05 % external  solution     . clobetasol ointment (TEMOVATE) 0.05 %     . folic acid (FOLVITE) 1 MG tablet Take 1 mg by mouth daily.    Marland Kitchen omeprazole (PRILOSEC) 20 MG capsule     . PROCTO-MED HC 2.5 % rectal cream     . ranitidine (ZANTAC)  150 MG tablet     . tamsulosin (FLOMAX) 0.4 MG CAPS capsule daily.    . vitamin B-12 (CYANOCOBALAMIN) 1000 MCG tablet Take 1,000 mcg by mouth daily.     No current facility-administered medications for this visit.    PAST MEDICAL HISTORY: Past Medical History  Diagnosis Date  . Crohn's disease ([place])   . Rectum cancer ([place])   . Psoriasis   . CVA (cerebral infarction)     PAST SURGICAL HISTORY: Past Surgical History  Procedure Laterality Date  . Rectum surgery      x 3  . Knee surgery Right     FAMILY HISTORY: Family History  Problem Relation Age of Onset  . Heart disease Father   . Diabetes Father   . Hypertension Mother   . Fibromyalgia Mother     SOCIAL HISTORY:  Social History   Social History  . Marital Status: Divorced    Spouse Name: N/A  . Number of Children: 2  . Years of Education: 9th grade   Occupational History  . Disabled    Social History Main Topics  . Smoking status: Current Every Day Smoker -- 5.00 packs/day    Types: Cigarettes  . Smokeless tobacco: Not on file  . Alcohol Use: No  . Drug Use: No  . Sexual Activity: Not on file   Other Topics Concern  . Not on file   Social History Narrative   Lives at home alone.   Right-handed.   8-10 sodas per day.     PHYSICAL EXAM   Filed Vitals:   08/14/15 1423  BP: 151/89  Pulse: 75  Height: _0  (1.727 m)  Weight: 161 lb 4 oz (73.143 kg)    Not recorded      Body mass index is 24.52 kg/(m^2).  PHYSICAL EXAMNIATION:  Gen: NAD, conversant, well nourised, obese, well groomed                     Cardiovascular: Regular rate rhythm, no peripheral edema, warm, nontender. Eyes: Conjunctivae clear without exudates or hemorrhage Neck: Supple, no carotid bruise. Pulmonary: Clear to auscultation bilaterally Skin: he has diffuse psoriasis lesions at bilateral thigh   NEUROLOGICAL EXAM:  MENTAL STATUS: Speech:    Speech is normal; fluent and spontaneous with normal comprehension.    Cognition:     Orientation to time, place and person     Normal recent and remote memory     Normal Attention span and concentration     Normal Language, naming, repeating,spontaneous speech     Fund of knowledge   CRANIAL NERVES: CN II: Visual fields are full to confrontation. Fundoscopic exam is normal with sharp discs and no vascular changes. Pupils are round equal and briskly reactive to light. CN III, IV, VI: extraocular movement are normal. No ptosis. CN V: Facial sensation is intact to pinprick in all 3 divisions bilaterally. Corneal responses are intact.  CN VII: Face is symmetric with normal eye closure and smile. CN VIII: Hearing is normal to rubbing fingers CN IX, X: Palate elevates symmetrically. Phonation is normal. CN XI: Head turning and shoulder shrug are intact CN XII:  Tongue is midline with normal movements and no atrophy.  MOTOR: There is no pronator drift of out-stretched arms. Muscle bulk and tone are normal. Mild fixation of left upper extremity upon rapid rotating movement  REFLEXES: Reflexes are 3 and symmetric at the biceps, triceps, knees, and ankles. Plantar responses are flexor.  SENSORY: Intact to light touch, pinprick, position sense, and vibration sense are intact in fingers and toes.  COORDINATION: Rapid alternating movements and fine finger movements are intact. He has mild left finger to nose dysmetria  GAIT/STANCE: Posture is normal. Gait is steady with normal steps, base, arm swing, and turning. Heel and toe walking are normal. Mild difficulty with tandem walking Romberg is absent.   DIAGNOSTIC DATA (LABS, IMAGING, TESTING) - I reviewed patient records, labs, notes, testing and imaging myself where available.   ASSESSMENT AND PLAN  Michael Levine is a 50 y.o. male    50 years old with history of acute stroke at right cerebellum in December 2016.  Continue daily aspirin 81 mg daily  Abnormal echocardiogram, diffuse hypokinesis, with  exception fraction 40-45%  Refer him to cardiologist for evaluation  Polymyalgia, elevated C reactive protein  Repeat C-reactive protein  Add on Cymbalta 60 mg daily  Neck pain  Hyperreflexia on examinations, need to rule out cervical spondylitic myelopathy, proceed with MRI of cervical spine  Marcial Pacas, M.D. Ph.D.  [facility] [address], [place], [place] [address] Ph: [phone] Fax: [phone]  BD:[id number] Hilda Blades, MD

## 2015-08-15 LAB — SEDIMENTATION RATE: Sed Rate: 2 mm/hr (ref 0–30)

## 2015-08-15 LAB — C-REACTIVE PROTEIN: CRP: 4.8 mg/L (ref 0.0–4.9)

## 2015-08-22 ENCOUNTER — Ambulatory Visit
Admission: RE | Admit: 2015-08-22 | Discharge: 2015-08-22 | Disposition: A | Discharge status: Home / Self Care | Payer: Medicare Other | Source: Ambulatory Visit | Attending: Neurology | Admitting: Neurology

## 2015-08-22 DIAGNOSIS — M50223 Other cervical disc displacement at C6-C7 level: Secondary | ICD-10-CM | POA: Diagnosis not present

## 2015-08-22 DIAGNOSIS — R9430 Abnormal result of cardiovascular function study, unspecified: Secondary | ICD-10-CM

## 2015-08-22 DIAGNOSIS — I639 Cerebral infarction, unspecified: Secondary | ICD-10-CM

## 2015-08-22 DIAGNOSIS — M50221 Other cervical disc displacement at C4-C5 level: Secondary | ICD-10-CM | POA: Diagnosis not present

## 2015-08-22 DIAGNOSIS — I10 Essential (primary) hypertension: Secondary | ICD-10-CM

## 2015-08-22 DIAGNOSIS — M50222 Other cervical disc displacement at C5-C6 level: Secondary | ICD-10-CM | POA: Diagnosis not present

## 2015-08-22 DIAGNOSIS — M47812 Spondylosis without myelopathy or radiculopathy, cervical region: Secondary | ICD-10-CM | POA: Diagnosis not present

## 2015-08-24 NOTE — Progress Notes (Signed)
Electrophysiology Office Note   Date:  08/25/2015   ID:  NAVAR THORSTENSON, DOB 1965-08-29, MRN [id number]  PCP:  Manon Hilding, MD  Primary Electrophysiologist:  Constance Haw, MD    Chief Complaint  Patient presents with  . Cardiomyopathy     History of Present Illness: Michael Levine is a 50 y.o. male who presents today for electrophysiology evaluation.   He had stroke on 06/13/15.  He had a history of Crohn's disease, it is currently under good control, rectal cancer in 1995, he had recurrent rectal cancer, required multiple surgery, followed by chemotherapy and radiation therapy, psoriasis.  He says he feels well today without any major complaints. He says that he is improving from his stroke symptoms. He was having visual difficulties or speech problems and weakness of his right arm. He had an echocardiogram which showed his EF was 40-45%. He has had no chest pain, shortness of breath, PND, or orthopnea.   Today, he denies symptoms of palpitations, chest pain, shortness of breath, orthopnea, PND, lower extremity edema, claudication, dizziness, presyncope, syncope, bleeding, or neurologic sequela. The patient is tolerating medications without difficulties and is otherwise without complaint today.    Past Medical History  Diagnosis Date  . Crohn's disease ([place])   . Rectum cancer ([place])   . Psoriasis   . CVA (cerebral infarction)    Past Surgical History  Procedure Laterality Date  . Rectum surgery      x 2  . Knee surgery Right      Current Outpatient Prescriptions  Medication Sig Dispense Refill  . aspirin 81 MG tablet Take 81 mg by mouth daily.    Marland Kitchen atorvastatin (LIPITOR) 10 MG tablet Take 10 mg by mouth daily at 6 PM.     . clobetasol (TEMOVATE) 0.05 % external solution Apply 1 application topically 3 (three) times daily. psoriasis    . clobetasol ointment (TEMOVATE) [id number] % Apply 1 application topically 3 (three) times daily. psoriasis    . diphenhydrAMINE-zinc  acetate (BENADRYL) cream Apply 1 application topically 3 (three) times daily as needed for itching.    . DULoxetine (CYMBALTA) 60 MG capsule Take 1 capsule (60 mg total) by mouth daily. 60 capsule 11  . folic acid (FOLVITE) 1 MG tablet Take 1 mg by mouth daily.    Marland Kitchen loratadine (CLARITIN) 10 MG tablet Take 10 mg by mouth daily.    Marland Kitchen omeprazole (PRILOSEC) 20 MG capsule Take 20 mg by mouth daily.     Marland Kitchen PROCTO-MED HC 2.5 % rectal cream Place 1 application rectally 3 (three) times daily.     . ranitidine (ZANTAC) 150 MG tablet Take 150 mg by mouth at bedtime.     . tamsulosin (FLOMAX) 0.4 MG CAPS capsule Take 0.4 mg by mouth daily.     . vitamin B-12 (CYANOCOBALAMIN) 1000 MCG tablet Take 1,000 mcg by mouth daily.    Marland Kitchen lisinopril (PRINIVIL,ZESTRIL) 10 MG tablet Take 1 tablet (10 mg total) by mouth daily. 90 tablet 3  . metoprolol succinate (TOPROL-XL) 50 MG 24 hr tablet Take 1 tablet (50 mg total) by mouth daily. Take with or immediately following a meal. 90 tablet 3   No current facility-administered medications for this visit.    Allergies:   Keflex and Augmentin   Social History:  The patient  reports that he has been smoking Cigarettes.  He has been smoking about 5.00 packs per day. He does not have any smokeless tobacco history on file.  He reports that he does not drink alcohol or use illicit drugs.   Family History:  The patient's family history includes Diabetes in his father; Fibromyalgia in his mother; Heart disease in his father; Hypertension in his mother.    ROS:  Please see the history of present illness.   Otherwise, review of systems is positive for leg pain, cough, wheezing, muscle pain, balance/walking problems, dizziness.   All other systems are reviewed and negative.    PHYSICAL EXAM: VS:  BP 138/86 mmHg  Pulse 65  Ht 5\' 5"  (1.651 m)  Wt 162 lb (73.483 kg)  BMI 26.96 kg/m2 , BMI Body mass index is 26.96 kg/(m^2). GEN: Well nourished, well developed, in no acute  distress HEENT: normal Neck: no JVD, carotid bruits, or masses Cardiac: RRR; no murmurs, rubs, or gallops,no edema  Respiratory:  clear to auscultation bilaterally, normal work of breathing GI: soft, nontender, nondistended, + BS MS: no deformity or atrophy Skin: warm and dry Neuro:  Strength and sensation are intact Psych: euthymic mood, full affect  EKG:  EKG is ordered today. The ekg ordered today shows sinus rhythm, APC, rate 65  Recent Labs: No results found for requested labs within last 365 days.    Lipid Panel  No results found for: CHOL, TRIG, HDL, CHOLHDL, VLDL, LDLCALC, LDLDIRECT   Wt Readings from Last 3 Encounters:  08/25/15 162 lb (73.483 kg)  08/14/15 161 lb 4 oz (73.143 kg)  06/13/15 162 lb (73.483 kg)      Other studies Reviewed: Additional studies/ records that were reviewed today include: TTE 06/27/15  Review of the above records today demonstrates:  - Mild to moderate global reduction in LV function (EF 45); grade 1 diastolic dysfunction; trace MR; mild TR.   ASSESSMENT AND PLAN:  1.  CVA: Discussed with him the possibility of monitoring for atrial fibrillation. I told him about the risks and benefits of the Linq monitoring. He said that he would like to think about this and Quincee Gittens call us back with an answer.  2. Undetermined cardiomyopathy, EF 40-45%: Currently not many symptoms of heart failure. He doesn't not have PND, orthopnea, or much shortness of breath with exertion. He is a smoker and continuing to smoke with other risk factors of hypertension. We'll therefore get a rest stress Myoview to see if he could potentially have coronary disease as a reversible cause for his cardiomyopathy.  Current medicines are reviewed at length with the patient today.   The patient does not have concerns regarding his medicines.  The following changes were made today:  Stop norvasc, start lisinopril, Toprol XL  Labs/ tests ordered today include:  Orders Placed This  Encounter  Procedures  . Myocardial Perfusion Imaging  . EKG 12-Lead     Disposition:   FU with Odilon Cass 3 months  Signed, Makaley Storts Meredith Leeds, MD  08/25/2015 2:51 PM     [facility] [address] [place] [address] [phone] (office) [phone] (fax)

## 2015-08-25 ENCOUNTER — Ambulatory Visit (INDEPENDENT_AMBULATORY_CARE_PROVIDER_SITE_OTHER): Payer: Medicare Other | Admitting: Cardiology

## 2015-08-25 ENCOUNTER — Encounter: Payer: Self-pay | Admitting: Cardiology

## 2015-08-25 VITALS — BP 138/86 | HR 65 | Ht 65.0 in | Wt 162.0 lb

## 2015-08-25 DIAGNOSIS — I422 Other hypertrophic cardiomyopathy: Secondary | ICD-10-CM

## 2015-08-25 DIAGNOSIS — I10 Essential (primary) hypertension: Secondary | ICD-10-CM

## 2015-08-25 MED ORDER — LISINOPRIL 10 MG PO TABS: 10.0000 mg | ORAL_TABLET | Freq: Every day | ORAL | Status: AC

## 2015-08-25 MED ORDER — METOPROLOL SUCCINATE ER 50 MG PO TB24: 50.0000 mg | ORAL_TABLET | Freq: Every day | ORAL | Status: DC

## 2015-08-25 NOTE — Patient Instructions (Signed)
Medication Instructions:  Your physician has recommended you make the following change in your medication:  1) stop Amlodipine 2) Start Toprol XL 50mg  daily 3) Start Lisinopril 10 mg daily   Labwork: None ordered   Testing/Procedures: Your physician has requested that you have en exercise stress myoview. For further information please visit [email or web address]. Please follow instruction sheet, as given.    Follow-Up: Your physician recommends that you schedule a follow-up appointment in: 3 months with Dr Curt Bears    Any Other Special Instructions Will Be Listed Below (If Applicable).     If you need a refill on your cardiac medications before your next appointment, please call your pharmacy.

## 2015-08-28 ENCOUNTER — Telehealth (HOSPITAL_COMMUNITY): Payer: Self-pay | Admitting: *Deleted

## 2015-08-28 ENCOUNTER — Telehealth: Payer: Self-pay | Admitting: Neurology

## 2015-08-28 ENCOUNTER — Telehealth (HOSPITAL_COMMUNITY): Payer: Self-pay | Admitting: Radiology

## 2015-08-28 NOTE — Telephone Encounter (Signed)
Patient given detailed instructions per Myocardial Perfusion Study Information Sheet for the test on 08/29/2015 at 7:15. Patient notified to arrive 15 minutes early and that it is imperative to arrive on time for appointment to keep from having the test rescheduled.  If you need to cancel or reschedule your appointment, please call the office within 24 hours of your appointment. Failure to do so may result in a cancellation of your appointment, and a $50 no show fee. Patient verbalized understanding.EHK

## 2015-08-28 NOTE — Telephone Encounter (Signed)
Please call patient MRI of cervical showed degenerative changes most prominent is at C4-5, with a left lateral disc protrusion, with mild canal and mild to moderate left foraminal stenosis, I will review MRI findings with him at next follow-up visit.  IMPRESSION: Abnormal MRI scan of cervical spine showing prominent spondylitic changes throughout most significant at C4-5 where there is left lateral disc protrusion resulting in mild canal and left-sided foraminal narrowing.

## 2015-08-28 NOTE — Telephone Encounter (Signed)
Left message on voicemail in reference to upcoming appointment scheduled for 08/29/15. Phone number given for a call back so details instructions can be given. Cadince Hilscher, Ranae Palms

## 2015-08-29 ENCOUNTER — Ambulatory Visit (HOSPITAL_COMMUNITY): Payer: Medicare Other | Attending: Internal Medicine

## 2015-08-29 DIAGNOSIS — Z87891 Personal history of nicotine dependence: Secondary | ICD-10-CM | POA: Diagnosis not present

## 2015-08-29 DIAGNOSIS — I422 Other hypertrophic cardiomyopathy: Secondary | ICD-10-CM | POA: Diagnosis not present

## 2015-08-29 DIAGNOSIS — I10 Essential (primary) hypertension: Secondary | ICD-10-CM

## 2015-08-29 DIAGNOSIS — R5383 Other fatigue: Secondary | ICD-10-CM | POA: Diagnosis not present

## 2015-08-29 DIAGNOSIS — R9439 Abnormal result of other cardiovascular function study: Secondary | ICD-10-CM | POA: Insufficient documentation

## 2015-08-29 LAB — MYOCARDIAL PERFUSION IMAGING
CHL CUP NUCLEAR SDS: 4
CHL CUP NUCLEAR SRS: 0
CHL CUP RESTING HR STRESS: 55 {beats}/min
LV dias vol: 141 mL (ref 62–150)
LV sys vol: 80 mL
Peak HR: 134 {beats}/min
RATE: 0.35
SSS: 4
TID: 1.09

## 2015-08-29 MED ORDER — TECHNETIUM TC 99M SESTAMIBI GENERIC - CARDIOLITE
10.3000 | Freq: Once | INTRAVENOUS | Status: AC | PRN
  Administered 2015-08-29: 10 via INTRAVENOUS

## 2015-08-29 MED ORDER — TECHNETIUM TC 99M SESTAMIBI GENERIC - CARDIOLITE
32.8000 | Freq: Once | INTRAVENOUS | Status: AC | PRN
  Administered 2015-08-29: 32.8 via INTRAVENOUS

## 2015-08-29 MED ORDER — REGADENOSON 0.4 MG/5ML IV SOLN
0.4000 mg | Freq: Once | INTRAVENOUS | Status: AC
  Administered 2015-08-29: 0.4 mg via INTRAVENOUS

## 2015-08-29 NOTE — Telephone Encounter (Signed)
Pt returned Michelle's call . Please call [phone]

## 2015-08-29 NOTE — Telephone Encounter (Signed)
Spoke to patient - he is aware of results and will keep his pending appt.

## 2015-08-29 NOTE — Telephone Encounter (Signed)
Left message for a return call

## 2015-09-04 ENCOUNTER — Encounter: Payer: Self-pay | Admitting: Cardiology

## 2015-09-04 NOTE — Telephone Encounter (Signed)
Follow Up  ° °Pt returned call for results °

## 2015-09-04 NOTE — Telephone Encounter (Signed)
This encounter was created in error - please disregard.

## 2015-09-18 DIAGNOSIS — J209 Acute bronchitis, unspecified: Secondary | ICD-10-CM | POA: Diagnosis not present

## 2015-10-04 DIAGNOSIS — R7301 Impaired fasting glucose: Secondary | ICD-10-CM | POA: Diagnosis not present

## 2015-10-04 DIAGNOSIS — I10 Essential (primary) hypertension: Secondary | ICD-10-CM | POA: Diagnosis not present

## 2015-10-04 DIAGNOSIS — E7800 Pure hypercholesterolemia, unspecified: Secondary | ICD-10-CM | POA: Diagnosis not present

## 2015-10-10 DIAGNOSIS — I429 Cardiomyopathy, unspecified: Secondary | ICD-10-CM | POA: Diagnosis not present

## 2015-10-10 DIAGNOSIS — I10 Essential (primary) hypertension: Secondary | ICD-10-CM | POA: Diagnosis not present

## 2015-10-10 DIAGNOSIS — I639 Cerebral infarction, unspecified: Secondary | ICD-10-CM | POA: Diagnosis not present

## 2015-10-10 DIAGNOSIS — M791 Myalgia: Secondary | ICD-10-CM | POA: Diagnosis not present

## 2015-10-10 DIAGNOSIS — R7301 Impaired fasting glucose: Secondary | ICD-10-CM | POA: Diagnosis not present

## 2015-10-10 DIAGNOSIS — L409 Psoriasis, unspecified: Secondary | ICD-10-CM | POA: Diagnosis not present

## 2015-10-23 ENCOUNTER — Ambulatory Visit: Payer: Medicare Other | Admitting: Neurology

## 2015-10-30 ENCOUNTER — Encounter: Payer: Self-pay | Admitting: Neurology

## 2015-10-30 ENCOUNTER — Ambulatory Visit (INDEPENDENT_AMBULATORY_CARE_PROVIDER_SITE_OTHER): Payer: Medicare Other | Admitting: Neurology

## 2015-10-30 VITALS — BP 118/75 | HR 68 | Ht 65.0 in | Wt 158.2 lb

## 2015-10-30 DIAGNOSIS — I639 Cerebral infarction, unspecified: Secondary | ICD-10-CM | POA: Diagnosis not present

## 2015-10-30 DIAGNOSIS — M47812 Spondylosis without myelopathy or radiculopathy, cervical region: Secondary | ICD-10-CM

## 2015-10-30 NOTE — Progress Notes (Signed)
Chief Complaint  Patient presents with  . Neck Pain    He would like to discuss his MRI results.  . Cerebrovascular Accident    He continues to take aspirin daily.  . Polymyalgia    He still has pain but decided not to try duloxetine because he thought it was a controlled substance.      PATIENT: Michael Levine DOB: 05/08/1966  Chief Complaint  Patient presents with  . Neck Pain    He would like to discuss his MRI results.  . Cerebrovascular Accident    He continues to take aspirin daily.  . Polymyalgia    He still has pain but decided not to try duloxetine because he thought it was a controlled substance.     HISTORICAL  Michael Levine is a 50 years old right-handed male, accompanied by his mother Michael Levine, seen in refer by his primary care physician Dr. Consuello Masse for evaluation of stroke in Jun 13 2015.  He had a history of Crohn's disease, it is currently under good control, rectal cancer in 1995, he had recurrent rectal cancer, required multiple surgery, followed by chemotherapy and radiation therapy, psoriasis  In May 07 2015, he had onset vertigo, unsteady gait lasted for 30 minutes, then he went back to baseline quickly, in December 31st 2016, he had second episode of sudden onset dizziness, this time is prolonged, lasting for a few days with associated nausea or vomiting, unsteady gait, blurry vision, he has severe nausea to the point of difficulty holding down the water, eventually was evaluated by his primary care physician Dr. Quintin Alto.  I reviewed MRI of the brain without contrast in June 07 2015 at [facility] diagnostic imaging, there is a mix of subacute and chronic infarction in the right cerebellum, suggesting right vertebral artery thromboembolic disease, no associated hemorrhagic or mass effect,   Laboratory evaluation June 02 2015, normal CMP, creatinine 0.9, normal CBC, hemoglobin 16 point 1, normal TSH,  CT angiogram of neck in June 12 2015, widely  patent bilateral carotid, vertebral artery without evidence of significant stenosis   Over the past few weeks, he had marked recovery, but continue feel intermittent vertigo, dizziness, clumsiness of both hands, especially left arm, mild unsteady gait, intermittent chest discomfort, pressure sensation, no palpitation noticed  UPDATE August 14 2015: He has no recurrent strokelike symptoms, still has mild unsteady gait, bilateral hands clumsiness, echocardiogram in February showed ejection fraction 40-45%, diffuse hypokinesia is, he denied history of chronic artery disease, denied chest pain, he has been taking aspirin daily,  Laboratory evaluation showed elevated C reactive protein of 20, otherwise normal ESR, low normal B12 218, mild elevated homocystine level 12.2, normal CPK, TSH,  he has been taking B12 supplement, no significant abnormality on hypercoagulable profile.  He complains of worsening psoriasis skin lesions, which involving bilateral thighs, he also complains of generalized muscle achy pain, joints pain, difficulty raising arm overhead,  He has significant neck pain, radiating pain to bilateral shoulder  UPDATE October 30 2015 We have personally reviewed MRI of cervical spine in April 2017: prominent spondylitic changes throughout most significant at C4-5 where there is left lateral disc protrusion resulting in mild canal and left-sided foraminal narrowing. He was evaluated by cardiologist, will have suggested possible long-term loop monitoring, I reviewed laboratory evaluation, normal B12, RPR, TSH. C-reactive protein Continue has mild breathing difficulty, frequent cough, concern of possibility of a diagnosis of Asbestos   REVIEW OF SYSTEMS: Full 14 system review of systems  performed and notable only for unexpected weight change, excessive sweaty, eye itching, wheezing,   Allergies  Allergen Reactions  . Keflex [Cephalexin] Anaphylaxis  . Augmentin [Amoxicillin-Pot Clavulanate]  Other (See Comments)    diarrhea    HOME MEDICATIONS: Current Outpatient Prescriptions  Medication Sig Dispense Refill  . aspirin 81 MG tablet Take 81 mg by mouth daily.    . clobetasol (TEMOVATE) 0.05 % external solution Apply 1 application topically 3 (three) times daily. psoriasis    . clobetasol ointment (TEMOVATE) 5.03 % Apply 1 application topically 3 (three) times daily. psoriasis    . diphenhydrAMINE-zinc acetate (BENADRYL) cream Apply 1 application topically 3 (three) times daily as needed for itching.    . DULoxetine (CYMBALTA) 60 MG capsule Take 1 capsule (60 mg total) by mouth daily. 60 capsule 11  . folic acid (FOLVITE) 1 MG tablet Take 1 mg by mouth daily.    Marland Kitchen lisinopril (PRINIVIL,ZESTRIL) 10 MG tablet Take 1 tablet (10 mg total) by mouth daily. 90 tablet 3  . loratadine (CLARITIN) 10 MG tablet Take 10 mg by mouth daily.    . metoprolol succinate (TOPROL-XL) 50 MG 24 hr tablet Take 1 tablet (50 mg total) by mouth daily. Take with or immediately following a meal. 90 tablet 3  . omeprazole (PRILOSEC) 20 MG capsule Take 20 mg by mouth daily.     Marland Kitchen PROCTO-MED HC 2.5 % rectal cream Place 1 application rectally 3 (three) times daily.     . ranitidine (ZANTAC) 150 MG tablet Take 150 mg by mouth at bedtime.     . rosuvastatin (CRESTOR) 5 MG tablet Take 5 mg by mouth every other day.     . tamsulosin (FLOMAX) 0.4 MG CAPS capsule Take 0.4 mg by mouth daily.     . vitamin B-12 (CYANOCOBALAMIN) 1000 MCG tablet Take 1,000 mcg by mouth daily.     No current facility-administered medications for this visit.    PAST MEDICAL HISTORY: Past Medical History  Diagnosis Date  . Crohn's disease ([place])   . Rectum cancer ([place])   . Psoriasis   . CVA (cerebral infarction)     PAST SURGICAL HISTORY: Past Surgical History  Procedure Laterality Date  . Rectum surgery      x 3  . Knee surgery Right     FAMILY HISTORY: Family History  Problem Relation Age of Onset  . Heart disease Father    . Diabetes Father   . Hypertension Mother   . Fibromyalgia Mother     SOCIAL HISTORY:  Social History   Social History  . Marital Status: Divorced    Spouse Name: N/A  . Number of Children: 2  . Years of Education: 9th grade   Occupational History  . Disabled    Social History Main Topics  . Smoking status: Current Every Day Smoker -- 5.00 packs/day    Types: Cigarettes  . Smokeless tobacco: Not on file  . Alcohol Use: No  . Drug Use: No  . Sexual Activity: Not on file   Other Topics Concern  . Not on file   Social History Narrative   Lives at home alone.   Right-handed.   8-10 sodas per day.     PHYSICAL EXAM   Filed Vitals:   10/30/15 1520  BP: 118/75  Pulse: 68  Height: 5' 5" (1.651 m)  Weight: 158 lb 4 oz (71.782 kg)    Not recorded      Body mass index is 26.33 kg/(m^2).  PHYSICAL EXAMNIATION:  Gen: NAD, conversant, well nourised, obese, well groomed                     Cardiovascular: Regular rate rhythm, no peripheral edema, warm, nontender. Eyes: Conjunctivae clear without exudates or hemorrhage Neck: Supple, no carotid bruise. Pulmonary: Clear to auscultation bilaterally Skin: he has diffuse psoriasis lesions at bilateral thigh   NEUROLOGICAL EXAM:  MENTAL STATUS: Speech:    Speech is normal; fluent and spontaneous with normal comprehension.  Cognition:     Orientation to time, place and person     Normal recent and remote memory     Normal Attention span and concentration     Normal Language, naming, repeating,spontaneous speech     Fund of knowledge   CRANIAL NERVES: CN II: Visual fields are full to confrontation. Fundoscopic exam is normal with sharp discs and no vascular changes. Pupils are round equal and briskly reactive to light. CN III, IV, VI: extraocular movement are normal. No ptosis. CN V: Facial sensation is intact to pinprick in all 3 divisions bilaterally. Corneal responses are intact.  CN VII: Face is symmetric  with normal eye closure and smile. CN VIII: Hearing is normal to rubbing fingers CN IX, X: Palate elevates symmetrically. Phonation is normal. CN XI: Head turning and shoulder shrug are intact CN XII: Tongue is midline with normal movements and no atrophy.  MOTOR: There is no pronator drift of out-stretched arms. Muscle bulk and tone are normal. Mild fixation of left upper extremity upon rapid rotating movement  REFLEXES: Reflexes are 3 and symmetric at the biceps, triceps, knees, and ankles. Plantar responses are flexor.  SENSORY: Intact to light touch, pinprick, position sense, and vibration sense are intact in fingers and toes.  COORDINATION: Rapid alternating movements and fine finger movements are intact. He has mild left finger to nose dysmetria  GAIT/STANCE: Posture is normal. Gait is steady with normal steps, base, arm swing, and turning. Heel and toe walking are normal. Mild difficulty with tandem walking Romberg is absent.   DIAGNOSTIC DATA (LABS, IMAGING, TESTING) - I reviewed patient records, labs, notes, testing and imaging myself where available.   ASSESSMENT AND PLAN  Zachory D Sivley is a 50 y.o. male    50 years old with history of acute stroke at right cerebellum in December 2016.  Continue daily aspirin 81 mg daily also advised him well hydration  Abnormal echocardiogram, diffuse hypokinesis, with exception fraction 40-45%  Continue cardiology evaluation  Polymyalgia, elevated C reactive protein  Repeat C-reactive protein within normal limit,  Add on Cymbalta 60 mg daily  Neck pain  MRI of the cervical spine showed no evidence of canal stenosis, most consistent with musculoskeletal etiology   , M.D. Ph.D.  [facility] [address], [address] [place], [place] [address] Ph: [phone] Fax: [phone]  CC:Paul W Sasser, MD   

## 2015-11-22 DIAGNOSIS — Z6824 Body mass index (BMI) 24.0-24.9, adult: Secondary | ICD-10-CM | POA: Diagnosis not present

## 2015-11-22 DIAGNOSIS — I639 Cerebral infarction, unspecified: Secondary | ICD-10-CM | POA: Diagnosis not present

## 2015-11-22 DIAGNOSIS — I69398 Other sequelae of cerebral infarction: Secondary | ICD-10-CM | POA: Diagnosis not present

## 2015-11-22 DIAGNOSIS — M4712 Other spondylosis with myelopathy, cervical region: Secondary | ICD-10-CM | POA: Diagnosis not present

## 2015-11-27 NOTE — Progress Notes (Signed)
This encounter was created in error - please disregard.

## 2015-11-28 ENCOUNTER — Encounter: Payer: Medicare Other | Admitting: Cardiology

## 2015-12-11 DIAGNOSIS — S8011XA Contusion of right lower leg, initial encounter: Secondary | ICD-10-CM | POA: Diagnosis not present

## 2015-12-11 DIAGNOSIS — Z6824 Body mass index (BMI) 24.0-24.9, adult: Secondary | ICD-10-CM | POA: Diagnosis not present

## 2015-12-11 DIAGNOSIS — M4712 Other spondylosis with myelopathy, cervical region: Secondary | ICD-10-CM | POA: Diagnosis not present

## 2015-12-14 DIAGNOSIS — M4712 Other spondylosis with myelopathy, cervical region: Secondary | ICD-10-CM | POA: Diagnosis not present

## 2015-12-14 DIAGNOSIS — M4802 Spinal stenosis, cervical region: Secondary | ICD-10-CM | POA: Diagnosis not present

## 2015-12-14 DIAGNOSIS — Z85038 Personal history of other malignant neoplasm of large intestine: Secondary | ICD-10-CM | POA: Diagnosis not present

## 2015-12-14 DIAGNOSIS — M9981 Other biomechanical lesions of cervical region: Secondary | ICD-10-CM | POA: Diagnosis not present

## 2015-12-14 DIAGNOSIS — M5032 Other cervical disc degeneration, mid-cervical region, unspecified level: Secondary | ICD-10-CM | POA: Diagnosis not present

## 2015-12-17 NOTE — Progress Notes (Deleted)
Electrophysiology Office Note   Date:  12/17/2015   ID:  KILO ZELTSER, DOB Jan 03, 1966, MRN [id number]  PCP:  Manon Hilding, MD  Primary Electrophysiologist:  Constance Haw, MD    No chief complaint on file.    History of Present Illness: Michael Levine is a 50 y.o. male who presents today for electrophysiology evaluation.   He had stroke on 06/13/15.  He had a history of Crohn's disease, it is currently under good control, rectal cancer in 1995, he had recurrent rectal cancer, required multiple surgery, followed by chemotherapy and radiation therapy, psoriasis.   He had an echocardiogram which showed his EF was 40-45%.   Today, he denies symptoms of palpitations, chest pain, shortness of breath, orthopnea, PND, lower extremity edema, claudication, dizziness, presyncope, syncope, bleeding, or neurologic sequela. The patient is tolerating medications without difficulties and is otherwise without complaint today.    Past Medical History:  Diagnosis Date  . Crohn's disease ([place])   . CVA (cerebral infarction)   . Psoriasis   . Rectum cancer [facility])    Past Surgical History:  Procedure Laterality Date  . KNEE SURGERY Right   . rectum surgery     x 2     Current Outpatient Prescriptions  Medication Sig Dispense Refill  . aspirin 81 MG tablet Take 81 mg by mouth daily.    . clobetasol (TEMOVATE) 0.05 % external solution Apply 1 application topically 3 (three) times daily. psoriasis    . clobetasol ointment (TEMOVATE) [id number] % Apply 1 application topically 3 (three) times daily. psoriasis    . diphenhydrAMINE-zinc acetate (BENADRYL) cream Apply 1 application topically 3 (three) times daily as needed for itching.    . DULoxetine (CYMBALTA) 60 MG capsule Take 1 capsule (60 mg total) by mouth daily. 60 capsule 11  . folic acid (FOLVITE) 1 MG tablet Take 1 mg by mouth daily.    Marland Kitchen lisinopril (PRINIVIL,ZESTRIL) 10 MG tablet Take 1 tablet (10 mg total) by mouth daily. 90 tablet 3    . loratadine (CLARITIN) 10 MG tablet Take 10 mg by mouth daily.    . metoprolol succinate (TOPROL-XL) 50 MG 24 hr tablet Take 1 tablet (50 mg total) by mouth daily. Take with or immediately following a meal. 90 tablet 3  . omeprazole (PRILOSEC) 20 MG capsule Take 20 mg by mouth daily.     Marland Kitchen PROCTO-MED HC 2.5 % rectal cream Place 1 application rectally 3 (three) times daily.     . ranitidine (ZANTAC) 150 MG tablet Take 150 mg by mouth at bedtime.     . rosuvastatin (CRESTOR) 5 MG tablet Take 5 mg by mouth every other day.     . tamsulosin (FLOMAX) 0.4 MG CAPS capsule Take 0.4 mg by mouth daily.     . vitamin B-12 (CYANOCOBALAMIN) 1000 MCG tablet Take 1,000 mcg by mouth daily.     No current facility-administered medications for this visit.     Allergies:   Keflex [cephalexin] and Augmentin [amoxicillin-pot clavulanate]   Social History:  The patient  reports that he has been smoking Cigarettes.  He has been smoking about 5.00 packs per day. He does not have any smokeless tobacco history on file. He reports that he does not drink alcohol or use drugs.   Family History:  The patient's family history includes Diabetes in his father; Fibromyalgia in his mother; Heart disease in his father; Hypertension in his mother.    ROS:  Please see the history  of present illness.   Otherwise, review of systems is positive for ***.   All other systems are reviewed and negative.    PHYSICAL EXAM: VS:  There were no vitals taken for this visit. , BMI There is no height or weight on file to calculate BMI. GEN: Well nourished, well developed, in no acute distress HEENT: normal Neck: no JVD, carotid bruits, or masses Cardiac: ; no murmurs, rubs, or gallops,no edema  Respiratory:  clear to auscultation bilaterally, normal work of breathing GI: soft, nontender, nondistended, + BS MS: no deformity or atrophy Skin: warm and dry Neuro:  Strength and sensation are intact Psych: euthymic mood, full  affect  EKG:  EKG is ordered today. Personal review of the ekg ordered today shows sinus rhythm, APC, rate 65  Recent Labs: No results found for requested labs within last 8760 hours.    Lipid Panel  No results found for: CHOL, TRIG, HDL, CHOLHDL, VLDL, LDLCALC, LDLDIRECT   Wt Readings from Last 3 Encounters:  10/30/15 158 lb 4 oz (71.8 kg)  08/25/15 162 lb (73.5 kg)  08/14/15 161 lb 4 oz (73.1 kg)      Other studies Reviewed: Additional studies/ records that were reviewed today include: TTE 06/27/15  Review of the above records today demonstrates:  - Mild to moderate global reduction in LV function (EF 45); grade 1 diastolic dysfunction; trace MR; mild TR.  Myoview 08/29/15  Nuclear stress EF: 43%.  There was no ST segment deviation noted during stress.  Defect 1: There is a small defect of mild severity present in the apex location.  This is an intermediate risk study.   Ejection fraction 43%, global hypokinesis. No ischemia identified, a small fixed apical defect. Ejection fraction correlates with echocardiogram findings of 40-45%. Overall intermediate risk study based solely on reduced ejection fraction. Nonischemic cardiomyopathy.  ASSESSMENT AND PLAN:  1.  CVA: Discussed with him the possibility of monitoring for atrial fibrillation. I told him about the risks and benefits of the Linq monitoring.   2. Undetermined cardiomyopathy, EF 40-45%: Currently not many symptoms of heart failure. He doesn't not have PND, orthopnea, or much shortness of breath with exertion. He is a smoker and continuing to smoke with other risk factors of hypertension. We'll therefore get a rest stress Myoview to see if he could potentially have coronary disease as a reversible cause for his cardiomyopathy.  Current medicines are reviewed at length with the patient today.   The patient does not have concerns regarding his medicines.  The following changes were made today:  Stop norvasc, start  lisinopril, Toprol XL  Labs/ tests ordered today include:  No orders of the defined types were placed in this encounter.    Disposition:   FU with Will Camnitz 3 months  Signed, Will Meredith Leeds, MD  12/17/2015 9:27 PM     [facility] [address] [place] [address] [phone] (office) [phone] (fax)

## 2015-12-18 ENCOUNTER — Ambulatory Visit: Payer: Medicare Other | Admitting: Cardiology

## 2015-12-27 DIAGNOSIS — R7301 Impaired fasting glucose: Secondary | ICD-10-CM | POA: Diagnosis not present

## 2015-12-27 DIAGNOSIS — E7800 Pure hypercholesterolemia, unspecified: Secondary | ICD-10-CM | POA: Diagnosis not present

## 2015-12-27 DIAGNOSIS — I10 Essential (primary) hypertension: Secondary | ICD-10-CM | POA: Diagnosis not present

## 2016-01-02 DIAGNOSIS — M5412 Radiculopathy, cervical region: Secondary | ICD-10-CM | POA: Diagnosis not present

## 2016-01-02 DIAGNOSIS — Z6823 Body mass index (BMI) 23.0-23.9, adult: Secondary | ICD-10-CM | POA: Diagnosis not present

## 2016-01-02 DIAGNOSIS — I10 Essential (primary) hypertension: Secondary | ICD-10-CM | POA: Diagnosis not present

## 2016-01-02 DIAGNOSIS — M791 Myalgia: Secondary | ICD-10-CM | POA: Diagnosis not present

## 2016-01-02 DIAGNOSIS — L409 Psoriasis, unspecified: Secondary | ICD-10-CM | POA: Diagnosis not present

## 2016-01-02 DIAGNOSIS — I429 Cardiomyopathy, unspecified: Secondary | ICD-10-CM | POA: Diagnosis not present

## 2016-01-02 DIAGNOSIS — R7301 Impaired fasting glucose: Secondary | ICD-10-CM | POA: Diagnosis not present

## 2016-01-29 DIAGNOSIS — L723 Sebaceous cyst: Secondary | ICD-10-CM | POA: Diagnosis not present

## 2016-01-29 DIAGNOSIS — R39198 Other difficulties with micturition: Secondary | ICD-10-CM | POA: Diagnosis not present

## 2016-01-29 DIAGNOSIS — N319 Neuromuscular dysfunction of bladder, unspecified: Secondary | ICD-10-CM | POA: Diagnosis not present

## 2016-02-02 DIAGNOSIS — M47812 Spondylosis without myelopathy or radiculopathy, cervical region: Secondary | ICD-10-CM | POA: Diagnosis not present

## 2016-02-23 DIAGNOSIS — L723 Sebaceous cyst: Secondary | ICD-10-CM | POA: Diagnosis not present

## 2016-02-29 DIAGNOSIS — Z882 Allergy status to sulfonamides status: Secondary | ICD-10-CM | POA: Diagnosis not present

## 2016-02-29 DIAGNOSIS — Z883 Allergy status to other anti-infective agents status: Secondary | ICD-10-CM | POA: Diagnosis not present

## 2016-02-29 DIAGNOSIS — Z79899 Other long term (current) drug therapy: Secondary | ICD-10-CM | POA: Diagnosis not present

## 2016-02-29 DIAGNOSIS — L308 Other specified dermatitis: Secondary | ICD-10-CM | POA: Diagnosis not present

## 2016-02-29 DIAGNOSIS — I10 Essential (primary) hypertension: Secondary | ICD-10-CM | POA: Diagnosis not present

## 2016-02-29 DIAGNOSIS — L720 Epidermal cyst: Secondary | ICD-10-CM | POA: Diagnosis not present

## 2016-02-29 DIAGNOSIS — Z888 Allergy status to other drugs, medicaments and biological substances status: Secondary | ICD-10-CM | POA: Diagnosis not present

## 2016-02-29 DIAGNOSIS — J40 Bronchitis, not specified as acute or chronic: Secondary | ICD-10-CM | POA: Diagnosis not present

## 2016-02-29 DIAGNOSIS — K219 Gastro-esophageal reflux disease without esophagitis: Secondary | ICD-10-CM | POA: Diagnosis not present

## 2016-02-29 DIAGNOSIS — M1990 Unspecified osteoarthritis, unspecified site: Secondary | ICD-10-CM | POA: Diagnosis not present

## 2016-02-29 DIAGNOSIS — F17200 Nicotine dependence, unspecified, uncomplicated: Secondary | ICD-10-CM | POA: Diagnosis not present

## 2016-02-29 DIAGNOSIS — L723 Sebaceous cyst: Secondary | ICD-10-CM | POA: Diagnosis not present

## 2016-02-29 DIAGNOSIS — Z85038 Personal history of other malignant neoplasm of large intestine: Secondary | ICD-10-CM | POA: Diagnosis not present

## 2016-02-29 DIAGNOSIS — Z8673 Personal history of transient ischemic attack (TIA), and cerebral infarction without residual deficits: Secondary | ICD-10-CM | POA: Diagnosis not present

## 2016-02-29 DIAGNOSIS — Z91018 Allergy to other foods: Secondary | ICD-10-CM | POA: Diagnosis not present

## 2016-02-29 DIAGNOSIS — Z7982 Long term (current) use of aspirin: Secondary | ICD-10-CM | POA: Diagnosis not present

## 2016-02-29 DIAGNOSIS — K50919 Crohn's disease, unspecified, with unspecified complications: Secondary | ICD-10-CM | POA: Diagnosis not present

## 2016-03-01 DIAGNOSIS — Z6824 Body mass index (BMI) 24.0-24.9, adult: Secondary | ICD-10-CM | POA: Diagnosis not present

## 2016-03-01 DIAGNOSIS — I10 Essential (primary) hypertension: Secondary | ICD-10-CM | POA: Diagnosis not present

## 2016-03-05 DIAGNOSIS — I10 Essential (primary) hypertension: Secondary | ICD-10-CM | POA: Diagnosis not present

## 2016-03-05 DIAGNOSIS — Z720 Tobacco use: Secondary | ICD-10-CM | POA: Diagnosis not present

## 2016-03-05 DIAGNOSIS — Z6824 Body mass index (BMI) 24.0-24.9, adult: Secondary | ICD-10-CM | POA: Diagnosis not present

## 2016-03-05 DIAGNOSIS — R079 Chest pain, unspecified: Secondary | ICD-10-CM | POA: Diagnosis not present

## 2016-03-29 DIAGNOSIS — L723 Sebaceous cyst: Secondary | ICD-10-CM | POA: Diagnosis not present

## 2016-05-15 DIAGNOSIS — E7800 Pure hypercholesterolemia, unspecified: Secondary | ICD-10-CM | POA: Diagnosis not present

## 2016-05-15 DIAGNOSIS — I10 Essential (primary) hypertension: Secondary | ICD-10-CM | POA: Diagnosis not present

## 2016-05-17 DIAGNOSIS — Z6824 Body mass index (BMI) 24.0-24.9, adult: Secondary | ICD-10-CM | POA: Diagnosis not present

## 2016-05-17 DIAGNOSIS — R7301 Impaired fasting glucose: Secondary | ICD-10-CM | POA: Diagnosis not present

## 2016-05-17 DIAGNOSIS — E7800 Pure hypercholesterolemia, unspecified: Secondary | ICD-10-CM | POA: Diagnosis not present

## 2016-05-17 DIAGNOSIS — I10 Essential (primary) hypertension: Secondary | ICD-10-CM | POA: Diagnosis not present

## 2016-05-17 DIAGNOSIS — I639 Cerebral infarction, unspecified: Secondary | ICD-10-CM | POA: Diagnosis not present

## 2016-05-17 DIAGNOSIS — M5412 Radiculopathy, cervical region: Secondary | ICD-10-CM | POA: Diagnosis not present

## 2016-05-17 DIAGNOSIS — M791 Myalgia: Secondary | ICD-10-CM | POA: Diagnosis not present

## 2016-05-17 DIAGNOSIS — L409 Psoriasis, unspecified: Secondary | ICD-10-CM | POA: Diagnosis not present

## 2016-06-14 DIAGNOSIS — L03032 Cellulitis of left toe: Secondary | ICD-10-CM | POA: Diagnosis not present

## 2016-07-11 ENCOUNTER — Other Ambulatory Visit: Payer: Self-pay | Admitting: Cardiology

## 2016-07-11 DIAGNOSIS — L409 Psoriasis, unspecified: Secondary | ICD-10-CM | POA: Diagnosis not present

## 2016-07-11 DIAGNOSIS — J0101 Acute recurrent maxillary sinusitis: Secondary | ICD-10-CM | POA: Diagnosis not present

## 2016-08-12 ENCOUNTER — Other Ambulatory Visit: Payer: Self-pay | Admitting: *Deleted

## 2016-08-12 MED ORDER — METOPROLOL SUCCINATE ER 50 MG PO TB24: 50.0000 mg | ORAL_TABLET | Freq: Every day | ORAL | 0 refills | Status: DC

## 2016-08-12 NOTE — Telephone Encounter (Signed)
metoprolol succinate (TOPROL-XL) 50 MG 24 hr tablet  Medication  Date: 07/11/2016 Department: [facility] Office Ordering/Authorizing: Will Meredith Leeds, MD  Order Providers   Prescribing Provider Encounter Provider  Will Meredith Leeds, MD Will Meredith Leeds, MD  Medication Detail    Disp Refills Start End   metoprolol succinate (TOPROL-XL) 50 MG 24 hr tablet 30 tablet 0 07/11/2016    Sig: Take one tablet by mouth daily with or immediately following a meal.*Please call and schedule an appointment for further refills*   E-Prescribing Status: Receipt confirmed by pharmacy (07/11/2016 4:02 PM EST)   Pharmacy   [facility], [place]

## 2016-10-21 ENCOUNTER — Other Ambulatory Visit: Payer: Self-pay | Admitting: Cardiology

## 2016-10-22 DIAGNOSIS — I10 Essential (primary) hypertension: Secondary | ICD-10-CM | POA: Diagnosis not present

## 2016-10-22 DIAGNOSIS — Z6823 Body mass index (BMI) 23.0-23.9, adult: Secondary | ICD-10-CM | POA: Diagnosis not present

## 2016-10-22 DIAGNOSIS — L409 Psoriasis, unspecified: Secondary | ICD-10-CM | POA: Diagnosis not present

## 2016-10-22 DIAGNOSIS — Z720 Tobacco use: Secondary | ICD-10-CM | POA: Diagnosis not present

## 2016-10-22 DIAGNOSIS — J0101 Acute recurrent maxillary sinusitis: Secondary | ICD-10-CM | POA: Diagnosis not present

## 2016-11-08 DIAGNOSIS — Z720 Tobacco use: Secondary | ICD-10-CM | POA: Diagnosis not present

## 2016-11-08 DIAGNOSIS — R7301 Impaired fasting glucose: Secondary | ICD-10-CM | POA: Diagnosis not present

## 2016-11-08 DIAGNOSIS — I429 Cardiomyopathy, unspecified: Secondary | ICD-10-CM | POA: Diagnosis not present

## 2016-11-08 DIAGNOSIS — I639 Cerebral infarction, unspecified: Secondary | ICD-10-CM | POA: Diagnosis not present

## 2016-11-08 DIAGNOSIS — E7800 Pure hypercholesterolemia, unspecified: Secondary | ICD-10-CM | POA: Diagnosis not present

## 2016-11-08 DIAGNOSIS — I10 Essential (primary) hypertension: Secondary | ICD-10-CM | POA: Diagnosis not present

## 2016-11-12 DIAGNOSIS — L4050 Arthropathic psoriasis, unspecified: Secondary | ICD-10-CM | POA: Diagnosis not present

## 2016-11-12 DIAGNOSIS — I10 Essential (primary) hypertension: Secondary | ICD-10-CM | POA: Diagnosis not present

## 2016-11-12 DIAGNOSIS — Z85038 Personal history of other malignant neoplasm of large intestine: Secondary | ICD-10-CM | POA: Diagnosis not present

## 2016-11-12 DIAGNOSIS — E782 Mixed hyperlipidemia: Secondary | ICD-10-CM | POA: Diagnosis not present

## 2016-11-12 DIAGNOSIS — L409 Psoriasis, unspecified: Secondary | ICD-10-CM | POA: Diagnosis not present

## 2016-11-12 DIAGNOSIS — Z0001 Encounter for general adult medical examination with abnormal findings: Secondary | ICD-10-CM | POA: Diagnosis not present

## 2016-11-12 DIAGNOSIS — R7301 Impaired fasting glucose: Secondary | ICD-10-CM | POA: Diagnosis not present

## 2016-12-05 DIAGNOSIS — Z79899 Other long term (current) drug therapy: Secondary | ICD-10-CM | POA: Diagnosis not present

## 2016-12-05 DIAGNOSIS — L400 Psoriasis vulgaris: Secondary | ICD-10-CM | POA: Diagnosis not present

## 2016-12-23 DIAGNOSIS — L400 Psoriasis vulgaris: Secondary | ICD-10-CM | POA: Diagnosis not present

## 2016-12-23 DIAGNOSIS — Z79899 Other long term (current) drug therapy: Secondary | ICD-10-CM | POA: Diagnosis not present

## 2016-12-23 DIAGNOSIS — L570 Actinic keratosis: Secondary | ICD-10-CM | POA: Diagnosis not present

## 2017-03-17 DIAGNOSIS — I10 Essential (primary) hypertension: Secondary | ICD-10-CM | POA: Diagnosis not present

## 2017-03-17 DIAGNOSIS — Z720 Tobacco use: Secondary | ICD-10-CM | POA: Diagnosis not present

## 2017-03-17 DIAGNOSIS — L409 Psoriasis, unspecified: Secondary | ICD-10-CM | POA: Diagnosis not present

## 2017-03-17 DIAGNOSIS — Z6823 Body mass index (BMI) 23.0-23.9, adult: Secondary | ICD-10-CM | POA: Diagnosis not present

## 2017-03-17 DIAGNOSIS — R7301 Impaired fasting glucose: Secondary | ICD-10-CM | POA: Diagnosis not present

## 2017-03-17 DIAGNOSIS — E7800 Pure hypercholesterolemia, unspecified: Secondary | ICD-10-CM | POA: Diagnosis not present

## 2017-03-17 DIAGNOSIS — I429 Cardiomyopathy, unspecified: Secondary | ICD-10-CM | POA: Diagnosis not present

## 2017-03-17 DIAGNOSIS — M5412 Radiculopathy, cervical region: Secondary | ICD-10-CM | POA: Diagnosis not present

## 2017-03-17 DIAGNOSIS — I639 Cerebral infarction, unspecified: Secondary | ICD-10-CM | POA: Diagnosis not present

## 2017-03-17 DIAGNOSIS — M791 Myalgia, unspecified site: Secondary | ICD-10-CM | POA: Diagnosis not present

## 2017-03-25 DIAGNOSIS — J0101 Acute recurrent maxillary sinusitis: Secondary | ICD-10-CM | POA: Diagnosis not present

## 2017-03-25 DIAGNOSIS — R05 Cough: Secondary | ICD-10-CM | POA: Diagnosis not present

## 2017-04-17 DIAGNOSIS — L400 Psoriasis vulgaris: Secondary | ICD-10-CM | POA: Diagnosis not present

## 2017-04-17 DIAGNOSIS — L309 Dermatitis, unspecified: Secondary | ICD-10-CM | POA: Diagnosis not present

## 2017-04-17 DIAGNOSIS — Z79899 Other long term (current) drug therapy: Secondary | ICD-10-CM | POA: Diagnosis not present

## 2017-04-17 DIAGNOSIS — L720 Epidermal cyst: Secondary | ICD-10-CM | POA: Diagnosis not present

## 2017-05-22 DIAGNOSIS — L0100 Impetigo, unspecified: Secondary | ICD-10-CM | POA: Diagnosis not present

## 2017-05-22 DIAGNOSIS — L280 Lichen simplex chronicus: Secondary | ICD-10-CM | POA: Diagnosis not present

## 2017-05-22 DIAGNOSIS — L400 Psoriasis vulgaris: Secondary | ICD-10-CM | POA: Diagnosis not present

## 2017-06-05 DIAGNOSIS — L0100 Impetigo, unspecified: Secondary | ICD-10-CM | POA: Diagnosis not present

## 2017-06-05 DIAGNOSIS — L400 Psoriasis vulgaris: Secondary | ICD-10-CM | POA: Diagnosis not present

## 2017-06-18 DIAGNOSIS — J069 Acute upper respiratory infection, unspecified: Secondary | ICD-10-CM | POA: Diagnosis not present

## 2017-06-18 DIAGNOSIS — H6503 Acute serous otitis media, bilateral: Secondary | ICD-10-CM | POA: Diagnosis not present

## 2017-07-07 DIAGNOSIS — Z79899 Other long term (current) drug therapy: Secondary | ICD-10-CM | POA: Diagnosis not present

## 2017-07-07 DIAGNOSIS — L400 Psoriasis vulgaris: Secondary | ICD-10-CM | POA: Diagnosis not present

## 2017-07-15 DIAGNOSIS — E782 Mixed hyperlipidemia: Secondary | ICD-10-CM | POA: Diagnosis not present

## 2017-07-15 DIAGNOSIS — I10 Essential (primary) hypertension: Secondary | ICD-10-CM | POA: Diagnosis not present

## 2017-07-15 DIAGNOSIS — E7800 Pure hypercholesterolemia, unspecified: Secondary | ICD-10-CM | POA: Diagnosis not present

## 2017-07-15 DIAGNOSIS — R7301 Impaired fasting glucose: Secondary | ICD-10-CM | POA: Diagnosis not present

## 2017-07-18 DIAGNOSIS — I429 Cardiomyopathy, unspecified: Secondary | ICD-10-CM | POA: Diagnosis not present

## 2017-07-18 DIAGNOSIS — M791 Myalgia, unspecified site: Secondary | ICD-10-CM | POA: Diagnosis not present

## 2017-07-18 DIAGNOSIS — Z720 Tobacco use: Secondary | ICD-10-CM | POA: Diagnosis not present

## 2017-07-18 DIAGNOSIS — R7301 Impaired fasting glucose: Secondary | ICD-10-CM | POA: Diagnosis not present

## 2017-07-18 DIAGNOSIS — E7800 Pure hypercholesterolemia, unspecified: Secondary | ICD-10-CM | POA: Diagnosis not present

## 2017-07-18 DIAGNOSIS — I639 Cerebral infarction, unspecified: Secondary | ICD-10-CM | POA: Diagnosis not present

## 2017-07-18 DIAGNOSIS — I10 Essential (primary) hypertension: Secondary | ICD-10-CM | POA: Diagnosis not present

## 2017-07-18 DIAGNOSIS — L409 Psoriasis, unspecified: Secondary | ICD-10-CM | POA: Diagnosis not present

## 2017-08-07 DIAGNOSIS — L0100 Impetigo, unspecified: Secondary | ICD-10-CM | POA: Diagnosis not present

## 2017-08-07 DIAGNOSIS — L400 Psoriasis vulgaris: Secondary | ICD-10-CM | POA: Diagnosis not present

## 2017-08-07 DIAGNOSIS — Z79899 Other long term (current) drug therapy: Secondary | ICD-10-CM | POA: Diagnosis not present

## 2017-09-30 DIAGNOSIS — L570 Actinic keratosis: Secondary | ICD-10-CM | POA: Diagnosis not present

## 2017-09-30 DIAGNOSIS — L280 Lichen simplex chronicus: Secondary | ICD-10-CM | POA: Diagnosis not present

## 2017-09-30 DIAGNOSIS — L400 Psoriasis vulgaris: Secondary | ICD-10-CM | POA: Diagnosis not present

## 2017-11-19 DIAGNOSIS — E7801 Familial hypercholesterolemia: Secondary | ICD-10-CM | POA: Diagnosis not present

## 2017-11-19 DIAGNOSIS — Z85038 Personal history of other malignant neoplasm of large intestine: Secondary | ICD-10-CM | POA: Diagnosis not present

## 2017-11-19 DIAGNOSIS — E782 Mixed hyperlipidemia: Secondary | ICD-10-CM | POA: Diagnosis not present

## 2017-11-19 DIAGNOSIS — I10 Essential (primary) hypertension: Secondary | ICD-10-CM | POA: Diagnosis not present

## 2017-11-19 DIAGNOSIS — R7301 Impaired fasting glucose: Secondary | ICD-10-CM | POA: Diagnosis not present

## 2017-11-20 DIAGNOSIS — M5412 Radiculopathy, cervical region: Secondary | ICD-10-CM | POA: Diagnosis not present

## 2017-11-20 DIAGNOSIS — Z720 Tobacco use: Secondary | ICD-10-CM | POA: Diagnosis not present

## 2017-11-20 DIAGNOSIS — I639 Cerebral infarction, unspecified: Secondary | ICD-10-CM | POA: Diagnosis not present

## 2017-11-20 DIAGNOSIS — I10 Essential (primary) hypertension: Secondary | ICD-10-CM | POA: Diagnosis not present

## 2017-11-20 DIAGNOSIS — E7800 Pure hypercholesterolemia, unspecified: Secondary | ICD-10-CM | POA: Diagnosis not present

## 2017-11-20 DIAGNOSIS — M791 Myalgia, unspecified site: Secondary | ICD-10-CM | POA: Diagnosis not present

## 2017-11-20 DIAGNOSIS — R7301 Impaired fasting glucose: Secondary | ICD-10-CM | POA: Diagnosis not present

## 2017-11-20 DIAGNOSIS — Z6823 Body mass index (BMI) 23.0-23.9, adult: Secondary | ICD-10-CM | POA: Diagnosis not present

## 2018-01-01 DIAGNOSIS — Z6823 Body mass index (BMI) 23.0-23.9, adult: Secondary | ICD-10-CM | POA: Diagnosis not present

## 2018-01-01 DIAGNOSIS — L03313 Cellulitis of chest wall: Secondary | ICD-10-CM | POA: Diagnosis not present

## 2018-01-01 DIAGNOSIS — R5383 Other fatigue: Secondary | ICD-10-CM | POA: Diagnosis not present

## 2018-01-20 DIAGNOSIS — Z79899 Other long term (current) drug therapy: Secondary | ICD-10-CM | POA: Diagnosis not present

## 2018-01-20 DIAGNOSIS — L400 Psoriasis vulgaris: Secondary | ICD-10-CM | POA: Diagnosis not present

## 2018-01-20 DIAGNOSIS — L0100 Impetigo, unspecified: Secondary | ICD-10-CM | POA: Diagnosis not present

## 2018-02-02 DIAGNOSIS — Z79899 Other long term (current) drug therapy: Secondary | ICD-10-CM | POA: Diagnosis not present

## 2018-02-02 DIAGNOSIS — A184 Tuberculosis of skin and subcutaneous tissue: Secondary | ICD-10-CM | POA: Diagnosis not present

## 2018-02-04 DIAGNOSIS — Z029 Encounter for administrative examinations, unspecified: Secondary | ICD-10-CM | POA: Diagnosis not present

## 2018-02-23 DIAGNOSIS — L400 Psoriasis vulgaris: Secondary | ICD-10-CM | POA: Diagnosis not present

## 2018-02-23 DIAGNOSIS — Z79899 Other long term (current) drug therapy: Secondary | ICD-10-CM | POA: Diagnosis not present

## 2018-03-07 IMAGING — NM NM MISC PROCEDURE
6 series · 36 of 36 positions shown · non-contrast
Comparison: none

[Series 1: stress-sum-em · 6.40mm/px · 6 of 64 frames shown]
[frame 6/64]
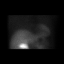
[frame 16/64]
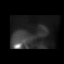
[frame 27/64]
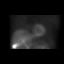
[frame 38/64]
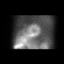
[frame 48/64]
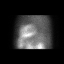
[frame 59/64]
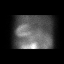

[Series 1: wbr_s-proj_st stress-sum-em · 6.40mm/px · 6 of 64 frames shown]
[frame 6/64]
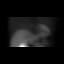
[frame 16/64]
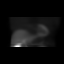
[frame 27/64]
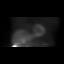
[frame 38/64]
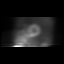
[frame 48/64]
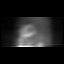
[frame 59/64]
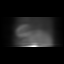

[Series 1: stress-gsp · 6.40mm/px · 6 of 512 frames shown]
[frame 43/512]
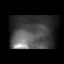
[frame 128/512]
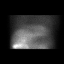
[frame 214/512]
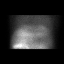
[frame 299/512]
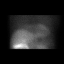
[frame 384/512]
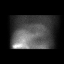
[frame 470/512]
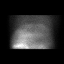

[Series 1: wbr_r-proj_st rest · 6.40mm/px · 6 of 64 frames shown]
[frame 6/64]
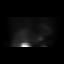
[frame 16/64]
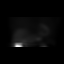
[frame 27/64]
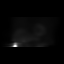
[frame 38/64]
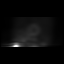
[frame 48/64]
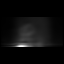
[frame 59/64]
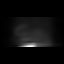

[Series 1: wbr_s-proj_st stress-gsp · 6.40mm/px · 6 of 512 frames shown]
[frame 43/512]
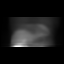
[frame 128/512]
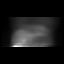
[frame 214/512]
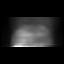
[frame 299/512]
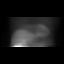
[frame 384/512]
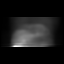
[frame 470/512]
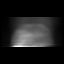

[Series 1: rest · 6.40mm/px · 6 of 64 frames shown]
[frame 6/64]
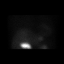
[frame 16/64]
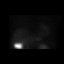
[frame 27/64]
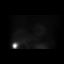
[frame 38/64]
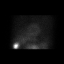
[frame 48/64]
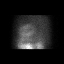
[frame 59/64]
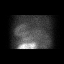

[36 of 36 positions shown; findings below may reference images not displayed]

Canned report from images found in remote index.

Refer to host system for actual result text.

## 2018-03-16 DIAGNOSIS — I10 Essential (primary) hypertension: Secondary | ICD-10-CM | POA: Diagnosis not present

## 2018-03-16 DIAGNOSIS — I639 Cerebral infarction, unspecified: Secondary | ICD-10-CM | POA: Diagnosis not present

## 2018-03-16 DIAGNOSIS — E7800 Pure hypercholesterolemia, unspecified: Secondary | ICD-10-CM | POA: Diagnosis not present

## 2018-03-16 DIAGNOSIS — L409 Psoriasis, unspecified: Secondary | ICD-10-CM | POA: Diagnosis not present

## 2018-03-16 DIAGNOSIS — R7301 Impaired fasting glucose: Secondary | ICD-10-CM | POA: Diagnosis not present

## 2018-03-16 DIAGNOSIS — Z720 Tobacco use: Secondary | ICD-10-CM | POA: Diagnosis not present

## 2018-03-16 DIAGNOSIS — Z79899 Other long term (current) drug therapy: Secondary | ICD-10-CM | POA: Diagnosis not present

## 2018-03-16 DIAGNOSIS — M791 Myalgia, unspecified site: Secondary | ICD-10-CM | POA: Diagnosis not present

## 2018-03-25 DIAGNOSIS — Z79899 Other long term (current) drug therapy: Secondary | ICD-10-CM | POA: Diagnosis not present

## 2018-03-25 DIAGNOSIS — L400 Psoriasis vulgaris: Secondary | ICD-10-CM | POA: Diagnosis not present

## 2018-04-22 DIAGNOSIS — L400 Psoriasis vulgaris: Secondary | ICD-10-CM | POA: Diagnosis not present

## 2018-04-22 DIAGNOSIS — Z79899 Other long term (current) drug therapy: Secondary | ICD-10-CM | POA: Diagnosis not present

## 2018-05-21 DIAGNOSIS — Z79899 Other long term (current) drug therapy: Secondary | ICD-10-CM | POA: Diagnosis not present

## 2018-05-21 DIAGNOSIS — L280 Lichen simplex chronicus: Secondary | ICD-10-CM | POA: Diagnosis not present

## 2018-05-21 DIAGNOSIS — L400 Psoriasis vulgaris: Secondary | ICD-10-CM | POA: Diagnosis not present

## 2018-06-23 DIAGNOSIS — L0100 Impetigo, unspecified: Secondary | ICD-10-CM | POA: Diagnosis not present

## 2018-06-23 DIAGNOSIS — L400 Psoriasis vulgaris: Secondary | ICD-10-CM | POA: Diagnosis not present

## 2018-06-23 DIAGNOSIS — Z79899 Other long term (current) drug therapy: Secondary | ICD-10-CM | POA: Diagnosis not present

## 2018-07-20 DIAGNOSIS — E782 Mixed hyperlipidemia: Secondary | ICD-10-CM | POA: Diagnosis not present

## 2018-07-20 DIAGNOSIS — K210 Gastro-esophageal reflux disease with esophagitis: Secondary | ICD-10-CM | POA: Diagnosis not present

## 2018-07-20 DIAGNOSIS — E7801 Familial hypercholesterolemia: Secondary | ICD-10-CM | POA: Diagnosis not present

## 2018-07-20 DIAGNOSIS — E7800 Pure hypercholesterolemia, unspecified: Secondary | ICD-10-CM | POA: Diagnosis not present

## 2018-07-20 DIAGNOSIS — I10 Essential (primary) hypertension: Secondary | ICD-10-CM | POA: Diagnosis not present

## 2018-07-20 DIAGNOSIS — R42 Dizziness and giddiness: Secondary | ICD-10-CM | POA: Diagnosis not present

## 2018-07-20 DIAGNOSIS — R7301 Impaired fasting glucose: Secondary | ICD-10-CM | POA: Diagnosis not present

## 2018-07-20 DIAGNOSIS — I429 Cardiomyopathy, unspecified: Secondary | ICD-10-CM | POA: Diagnosis not present

## 2018-07-21 DIAGNOSIS — Z79899 Other long term (current) drug therapy: Secondary | ICD-10-CM | POA: Diagnosis not present

## 2018-07-21 DIAGNOSIS — L400 Psoriasis vulgaris: Secondary | ICD-10-CM | POA: Diagnosis not present

## 2018-07-21 DIAGNOSIS — K130 Diseases of lips: Secondary | ICD-10-CM | POA: Diagnosis not present

## 2018-07-22 DIAGNOSIS — Z0001 Encounter for general adult medical examination with abnormal findings: Secondary | ICD-10-CM | POA: Diagnosis not present

## 2018-07-22 DIAGNOSIS — I10 Essential (primary) hypertension: Secondary | ICD-10-CM | POA: Diagnosis not present

## 2018-07-22 DIAGNOSIS — Z720 Tobacco use: Secondary | ICD-10-CM | POA: Diagnosis not present

## 2018-08-18 DIAGNOSIS — Z79899 Other long term (current) drug therapy: Secondary | ICD-10-CM | POA: Diagnosis not present

## 2018-08-18 DIAGNOSIS — L400 Psoriasis vulgaris: Secondary | ICD-10-CM | POA: Diagnosis not present

## 2018-08-18 DIAGNOSIS — L0100 Impetigo, unspecified: Secondary | ICD-10-CM | POA: Diagnosis not present

## 2018-09-15 DIAGNOSIS — L720 Epidermal cyst: Secondary | ICD-10-CM | POA: Diagnosis not present

## 2018-09-15 DIAGNOSIS — L400 Psoriasis vulgaris: Secondary | ICD-10-CM | POA: Diagnosis not present

## 2018-09-15 DIAGNOSIS — Z79899 Other long term (current) drug therapy: Secondary | ICD-10-CM | POA: Diagnosis not present

## 2018-10-13 DIAGNOSIS — L400 Psoriasis vulgaris: Secondary | ICD-10-CM | POA: Diagnosis not present

## 2018-10-13 DIAGNOSIS — D1801 Hemangioma of skin and subcutaneous tissue: Secondary | ICD-10-CM | POA: Diagnosis not present

## 2018-10-13 DIAGNOSIS — Z79899 Other long term (current) drug therapy: Secondary | ICD-10-CM | POA: Diagnosis not present

## 2018-11-10 DIAGNOSIS — L400 Psoriasis vulgaris: Secondary | ICD-10-CM | POA: Diagnosis not present

## 2018-11-10 DIAGNOSIS — Z79899 Other long term (current) drug therapy: Secondary | ICD-10-CM | POA: Diagnosis not present

## 2018-11-10 DIAGNOSIS — L710 Perioral dermatitis: Secondary | ICD-10-CM | POA: Diagnosis not present

## 2018-12-08 DIAGNOSIS — Z79899 Other long term (current) drug therapy: Secondary | ICD-10-CM | POA: Diagnosis not present

## 2018-12-08 DIAGNOSIS — L570 Actinic keratosis: Secondary | ICD-10-CM | POA: Diagnosis not present

## 2018-12-08 DIAGNOSIS — L400 Psoriasis vulgaris: Secondary | ICD-10-CM | POA: Diagnosis not present

## 2019-01-05 DIAGNOSIS — L400 Psoriasis vulgaris: Secondary | ICD-10-CM | POA: Diagnosis not present

## 2019-01-05 DIAGNOSIS — Z79899 Other long term (current) drug therapy: Secondary | ICD-10-CM | POA: Diagnosis not present

## 2019-01-05 DIAGNOSIS — L720 Epidermal cyst: Secondary | ICD-10-CM | POA: Diagnosis not present

## 2019-01-21 DIAGNOSIS — E782 Mixed hyperlipidemia: Secondary | ICD-10-CM | POA: Diagnosis not present

## 2019-01-21 DIAGNOSIS — R7301 Impaired fasting glucose: Secondary | ICD-10-CM | POA: Diagnosis not present

## 2019-01-21 DIAGNOSIS — E7800 Pure hypercholesterolemia, unspecified: Secondary | ICD-10-CM | POA: Diagnosis not present

## 2019-01-21 DIAGNOSIS — I10 Essential (primary) hypertension: Secondary | ICD-10-CM | POA: Diagnosis not present

## 2019-01-27 DIAGNOSIS — M791 Myalgia, unspecified site: Secondary | ICD-10-CM | POA: Diagnosis not present

## 2019-01-27 DIAGNOSIS — I10 Essential (primary) hypertension: Secondary | ICD-10-CM | POA: Diagnosis not present

## 2019-01-27 DIAGNOSIS — I639 Cerebral infarction, unspecified: Secondary | ICD-10-CM | POA: Diagnosis not present

## 2019-01-27 DIAGNOSIS — K611 Rectal abscess: Secondary | ICD-10-CM | POA: Diagnosis not present

## 2019-01-27 DIAGNOSIS — R7301 Impaired fasting glucose: Secondary | ICD-10-CM | POA: Diagnosis not present

## 2019-01-27 DIAGNOSIS — Z720 Tobacco use: Secondary | ICD-10-CM | POA: Diagnosis not present

## 2019-01-27 DIAGNOSIS — I429 Cardiomyopathy, unspecified: Secondary | ICD-10-CM | POA: Diagnosis not present

## 2019-02-08 DIAGNOSIS — L400 Psoriasis vulgaris: Secondary | ICD-10-CM | POA: Diagnosis not present

## 2019-02-08 DIAGNOSIS — Z79899 Other long term (current) drug therapy: Secondary | ICD-10-CM | POA: Diagnosis not present

## 2019-03-10 DIAGNOSIS — K130 Diseases of lips: Secondary | ICD-10-CM | POA: Diagnosis not present

## 2019-03-10 DIAGNOSIS — L570 Actinic keratosis: Secondary | ICD-10-CM | POA: Diagnosis not present

## 2019-03-10 DIAGNOSIS — L409 Psoriasis, unspecified: Secondary | ICD-10-CM | POA: Diagnosis not present

## 2019-04-12 DIAGNOSIS — K130 Diseases of lips: Secondary | ICD-10-CM | POA: Diagnosis not present

## 2019-04-12 DIAGNOSIS — L280 Lichen simplex chronicus: Secondary | ICD-10-CM | POA: Diagnosis not present

## 2019-04-12 DIAGNOSIS — L409 Psoriasis, unspecified: Secondary | ICD-10-CM | POA: Diagnosis not present

## 2019-05-11 DIAGNOSIS — U071 COVID-19: Secondary | ICD-10-CM | POA: Diagnosis not present

## 2019-05-19 DIAGNOSIS — L409 Psoriasis, unspecified: Secondary | ICD-10-CM | POA: Diagnosis not present

## 2019-05-19 DIAGNOSIS — L309 Dermatitis, unspecified: Secondary | ICD-10-CM | POA: Diagnosis not present

## 2019-06-16 DIAGNOSIS — L409 Psoriasis, unspecified: Secondary | ICD-10-CM | POA: Diagnosis not present

## 2019-06-16 DIAGNOSIS — Z79899 Other long term (current) drug therapy: Secondary | ICD-10-CM | POA: Diagnosis not present

## 2019-07-14 DIAGNOSIS — Z79899 Other long term (current) drug therapy: Secondary | ICD-10-CM | POA: Diagnosis not present

## 2019-07-14 DIAGNOSIS — L409 Psoriasis, unspecified: Secondary | ICD-10-CM | POA: Diagnosis not present

## 2019-07-23 DIAGNOSIS — I10 Essential (primary) hypertension: Secondary | ICD-10-CM | POA: Diagnosis not present

## 2019-07-23 DIAGNOSIS — E782 Mixed hyperlipidemia: Secondary | ICD-10-CM | POA: Diagnosis not present

## 2019-07-23 DIAGNOSIS — R7301 Impaired fasting glucose: Secondary | ICD-10-CM | POA: Diagnosis not present

## 2019-07-23 DIAGNOSIS — K2100 Gastro-esophageal reflux disease with esophagitis, without bleeding: Secondary | ICD-10-CM | POA: Diagnosis not present

## 2019-07-27 DIAGNOSIS — Z720 Tobacco use: Secondary | ICD-10-CM | POA: Diagnosis not present

## 2019-07-27 DIAGNOSIS — I429 Cardiomyopathy, unspecified: Secondary | ICD-10-CM | POA: Diagnosis not present

## 2019-07-27 DIAGNOSIS — E7800 Pure hypercholesterolemia, unspecified: Secondary | ICD-10-CM | POA: Diagnosis not present

## 2019-07-27 DIAGNOSIS — I10 Essential (primary) hypertension: Secondary | ICD-10-CM | POA: Diagnosis not present

## 2019-07-27 DIAGNOSIS — M791 Myalgia, unspecified site: Secondary | ICD-10-CM | POA: Diagnosis not present

## 2019-07-27 DIAGNOSIS — K611 Rectal abscess: Secondary | ICD-10-CM | POA: Diagnosis not present

## 2019-07-27 DIAGNOSIS — I639 Cerebral infarction, unspecified: Secondary | ICD-10-CM | POA: Diagnosis not present

## 2019-08-11 DIAGNOSIS — D179 Benign lipomatous neoplasm, unspecified: Secondary | ICD-10-CM | POA: Diagnosis not present

## 2019-08-11 DIAGNOSIS — Z79899 Other long term (current) drug therapy: Secondary | ICD-10-CM | POA: Diagnosis not present

## 2019-08-11 DIAGNOSIS — L409 Psoriasis, unspecified: Secondary | ICD-10-CM | POA: Diagnosis not present

## 2019-09-03 DIAGNOSIS — R05 Cough: Secondary | ICD-10-CM | POA: Diagnosis not present

## 2019-09-03 DIAGNOSIS — J309 Allergic rhinitis, unspecified: Secondary | ICD-10-CM | POA: Diagnosis not present

## 2019-09-07 DIAGNOSIS — L409 Psoriasis, unspecified: Secondary | ICD-10-CM | POA: Diagnosis not present

## 2019-09-07 DIAGNOSIS — Z79899 Other long term (current) drug therapy: Secondary | ICD-10-CM | POA: Diagnosis not present

## 2019-10-06 DIAGNOSIS — L570 Actinic keratosis: Secondary | ICD-10-CM | POA: Diagnosis not present

## 2019-10-06 DIAGNOSIS — I781 Nevus, non-neoplastic: Secondary | ICD-10-CM | POA: Diagnosis not present

## 2019-10-06 DIAGNOSIS — D239 Other benign neoplasm of skin, unspecified: Secondary | ICD-10-CM | POA: Diagnosis not present

## 2019-11-03 DIAGNOSIS — L909 Atrophic disorder of skin, unspecified: Secondary | ICD-10-CM | POA: Diagnosis not present

## 2019-11-03 DIAGNOSIS — Z79899 Other long term (current) drug therapy: Secondary | ICD-10-CM | POA: Diagnosis not present

## 2019-12-02 DIAGNOSIS — L409 Psoriasis, unspecified: Secondary | ICD-10-CM | POA: Diagnosis not present

## 2019-12-02 DIAGNOSIS — Z79899 Other long term (current) drug therapy: Secondary | ICD-10-CM | POA: Diagnosis not present

## 2019-12-30 DIAGNOSIS — B3783 Candidal cheilitis: Secondary | ICD-10-CM | POA: Diagnosis not present

## 2019-12-30 DIAGNOSIS — L409 Psoriasis, unspecified: Secondary | ICD-10-CM | POA: Diagnosis not present

## 2020-01-20 DIAGNOSIS — R7301 Impaired fasting glucose: Secondary | ICD-10-CM | POA: Diagnosis not present

## 2020-01-20 DIAGNOSIS — E7801 Familial hypercholesterolemia: Secondary | ICD-10-CM | POA: Diagnosis not present

## 2020-01-20 DIAGNOSIS — E782 Mixed hyperlipidemia: Secondary | ICD-10-CM | POA: Diagnosis not present

## 2020-01-20 DIAGNOSIS — K2100 Gastro-esophageal reflux disease with esophagitis, without bleeding: Secondary | ICD-10-CM | POA: Diagnosis not present

## 2020-01-20 DIAGNOSIS — I10 Essential (primary) hypertension: Secondary | ICD-10-CM | POA: Diagnosis not present

## 2020-01-24 DIAGNOSIS — Z6824 Body mass index (BMI) 24.0-24.9, adult: Secondary | ICD-10-CM | POA: Diagnosis not present

## 2020-01-24 DIAGNOSIS — Z720 Tobacco use: Secondary | ICD-10-CM | POA: Diagnosis not present

## 2020-01-24 DIAGNOSIS — E7800 Pure hypercholesterolemia, unspecified: Secondary | ICD-10-CM | POA: Diagnosis not present

## 2020-01-24 DIAGNOSIS — K611 Rectal abscess: Secondary | ICD-10-CM | POA: Diagnosis not present

## 2020-01-24 DIAGNOSIS — R7301 Impaired fasting glucose: Secondary | ICD-10-CM | POA: Diagnosis not present

## 2020-01-24 DIAGNOSIS — Z7189 Other specified counseling: Secondary | ICD-10-CM | POA: Diagnosis not present

## 2020-01-24 DIAGNOSIS — F17200 Nicotine dependence, unspecified, uncomplicated: Secondary | ICD-10-CM | POA: Diagnosis not present

## 2020-01-24 DIAGNOSIS — I10 Essential (primary) hypertension: Secondary | ICD-10-CM | POA: Diagnosis not present

## 2020-01-27 DIAGNOSIS — B3783 Candidal cheilitis: Secondary | ICD-10-CM | POA: Diagnosis not present

## 2020-01-27 DIAGNOSIS — L0100 Impetigo, unspecified: Secondary | ICD-10-CM | POA: Diagnosis not present

## 2020-02-28 DIAGNOSIS — B3783 Candidal cheilitis: Secondary | ICD-10-CM | POA: Diagnosis not present

## 2020-02-28 DIAGNOSIS — L409 Psoriasis, unspecified: Secondary | ICD-10-CM | POA: Diagnosis not present

## 2020-02-28 DIAGNOSIS — L570 Actinic keratosis: Secondary | ICD-10-CM | POA: Diagnosis not present

## 2020-03-30 DIAGNOSIS — L409 Psoriasis, unspecified: Secondary | ICD-10-CM | POA: Diagnosis not present

## 2020-03-30 DIAGNOSIS — Z79899 Other long term (current) drug therapy: Secondary | ICD-10-CM | POA: Diagnosis not present

## 2020-04-19 DIAGNOSIS — R0609 Other forms of dyspnea: Secondary | ICD-10-CM | POA: Diagnosis not present

## 2020-04-19 DIAGNOSIS — M79602 Pain in left arm: Secondary | ICD-10-CM | POA: Diagnosis not present

## 2020-04-19 DIAGNOSIS — Z8673 Personal history of transient ischemic attack (TIA), and cerebral infarction without residual deficits: Secondary | ICD-10-CM | POA: Diagnosis not present

## 2020-04-19 DIAGNOSIS — Z20822 Contact with and (suspected) exposure to covid-19: Secondary | ICD-10-CM | POA: Diagnosis not present

## 2020-04-19 DIAGNOSIS — E785 Hyperlipidemia, unspecified: Secondary | ICD-10-CM | POA: Diagnosis not present

## 2020-04-19 DIAGNOSIS — K219 Gastro-esophageal reflux disease without esophagitis: Secondary | ICD-10-CM | POA: Diagnosis not present

## 2020-04-19 DIAGNOSIS — F17210 Nicotine dependence, cigarettes, uncomplicated: Secondary | ICD-10-CM | POA: Diagnosis not present

## 2020-04-19 DIAGNOSIS — R0602 Shortness of breath: Secondary | ICD-10-CM | POA: Diagnosis not present

## 2020-04-19 DIAGNOSIS — M542 Cervicalgia: Secondary | ICD-10-CM | POA: Diagnosis not present

## 2020-04-19 DIAGNOSIS — R072 Precordial pain: Secondary | ICD-10-CM | POA: Diagnosis not present

## 2020-04-19 DIAGNOSIS — R0789 Other chest pain: Secondary | ICD-10-CM | POA: Diagnosis not present

## 2020-04-19 DIAGNOSIS — R112 Nausea with vomiting, unspecified: Secondary | ICD-10-CM | POA: Diagnosis not present

## 2020-04-19 DIAGNOSIS — Z79899 Other long term (current) drug therapy: Secondary | ICD-10-CM | POA: Diagnosis not present

## 2020-04-19 DIAGNOSIS — Z881 Allergy status to other antibiotic agents status: Secondary | ICD-10-CM | POA: Diagnosis not present

## 2020-04-19 DIAGNOSIS — R9431 Abnormal electrocardiogram [ECG] [EKG]: Secondary | ICD-10-CM | POA: Diagnosis not present

## 2020-04-19 DIAGNOSIS — R059 Cough, unspecified: Secondary | ICD-10-CM | POA: Diagnosis not present

## 2020-04-19 DIAGNOSIS — R079 Chest pain, unspecified: Secondary | ICD-10-CM | POA: Diagnosis not present

## 2020-04-19 DIAGNOSIS — I200 Unstable angina: Secondary | ICD-10-CM | POA: Diagnosis not present

## 2020-04-19 DIAGNOSIS — I214 Non-ST elevation (NSTEMI) myocardial infarction: Secondary | ICD-10-CM | POA: Diagnosis not present

## 2020-04-19 DIAGNOSIS — I10 Essential (primary) hypertension: Secondary | ICD-10-CM | POA: Diagnosis not present

## 2020-04-20 DIAGNOSIS — E782 Mixed hyperlipidemia: Secondary | ICD-10-CM | POA: Diagnosis not present

## 2020-04-20 DIAGNOSIS — I10 Essential (primary) hypertension: Secondary | ICD-10-CM | POA: Diagnosis not present

## 2020-04-20 DIAGNOSIS — Z7982 Long term (current) use of aspirin: Secondary | ICD-10-CM | POA: Diagnosis not present

## 2020-04-20 DIAGNOSIS — Z79899 Other long term (current) drug therapy: Secondary | ICD-10-CM | POA: Diagnosis not present

## 2020-04-20 DIAGNOSIS — Z720 Tobacco use: Secondary | ICD-10-CM | POA: Diagnosis not present

## 2020-04-20 DIAGNOSIS — Z881 Allergy status to other antibiotic agents status: Secondary | ICD-10-CM | POA: Diagnosis not present

## 2020-04-20 DIAGNOSIS — Z880 Allergy status to penicillin: Secondary | ICD-10-CM | POA: Diagnosis not present

## 2020-04-20 DIAGNOSIS — R079 Chest pain, unspecified: Secondary | ICD-10-CM | POA: Diagnosis not present

## 2020-04-20 DIAGNOSIS — I214 Non-ST elevation (NSTEMI) myocardial infarction: Secondary | ICD-10-CM | POA: Diagnosis not present

## 2020-04-20 DIAGNOSIS — E785 Hyperlipidemia, unspecified: Secondary | ICD-10-CM | POA: Diagnosis not present

## 2020-04-20 DIAGNOSIS — R0602 Shortness of breath: Secondary | ICD-10-CM | POA: Diagnosis not present

## 2020-04-20 DIAGNOSIS — I998 Other disorder of circulatory system: Secondary | ICD-10-CM | POA: Diagnosis not present

## 2020-04-20 DIAGNOSIS — I259 Chronic ischemic heart disease, unspecified: Secondary | ICD-10-CM | POA: Diagnosis not present

## 2020-04-20 DIAGNOSIS — I200 Unstable angina: Secondary | ICD-10-CM | POA: Diagnosis not present

## 2020-04-21 DIAGNOSIS — I5022 Chronic systolic (congestive) heart failure: Secondary | ICD-10-CM | POA: Diagnosis not present

## 2020-04-21 DIAGNOSIS — Z720 Tobacco use: Secondary | ICD-10-CM | POA: Diagnosis not present

## 2020-04-21 DIAGNOSIS — I69351 Hemiplegia and hemiparesis following cerebral infarction affecting right dominant side: Secondary | ICD-10-CM | POA: Diagnosis not present

## 2020-04-21 DIAGNOSIS — R0602 Shortness of breath: Secondary | ICD-10-CM | POA: Diagnosis not present

## 2020-04-21 DIAGNOSIS — I249 Acute ischemic heart disease, unspecified: Secondary | ICD-10-CM | POA: Diagnosis not present

## 2020-04-21 DIAGNOSIS — I213 ST elevation (STEMI) myocardial infarction of unspecified site: Secondary | ICD-10-CM | POA: Diagnosis not present

## 2020-04-21 DIAGNOSIS — I110 Hypertensive heart disease with heart failure: Secondary | ICD-10-CM | POA: Diagnosis not present

## 2020-04-21 DIAGNOSIS — R079 Chest pain, unspecified: Secondary | ICD-10-CM | POA: Diagnosis not present

## 2020-04-21 DIAGNOSIS — Z8673 Personal history of transient ischemic attack (TIA), and cerebral infarction without residual deficits: Secondary | ICD-10-CM | POA: Diagnosis not present

## 2020-04-21 DIAGNOSIS — F17210 Nicotine dependence, cigarettes, uncomplicated: Secondary | ICD-10-CM | POA: Diagnosis not present

## 2020-04-21 DIAGNOSIS — F17200 Nicotine dependence, unspecified, uncomplicated: Secondary | ICD-10-CM | POA: Diagnosis not present

## 2020-04-21 DIAGNOSIS — Z7982 Long term (current) use of aspirin: Secondary | ICD-10-CM | POA: Diagnosis not present

## 2020-04-21 DIAGNOSIS — Z9221 Personal history of antineoplastic chemotherapy: Secondary | ICD-10-CM | POA: Diagnosis not present

## 2020-04-21 DIAGNOSIS — R7303 Prediabetes: Secondary | ICD-10-CM | POA: Diagnosis not present

## 2020-04-21 DIAGNOSIS — Z923 Personal history of irradiation: Secondary | ICD-10-CM | POA: Diagnosis not present

## 2020-04-21 DIAGNOSIS — E785 Hyperlipidemia, unspecified: Secondary | ICD-10-CM | POA: Diagnosis not present

## 2020-04-21 DIAGNOSIS — K5090 Crohn's disease, unspecified, without complications: Secondary | ICD-10-CM | POA: Diagnosis not present

## 2020-04-21 DIAGNOSIS — I2510 Atherosclerotic heart disease of native coronary artery without angina pectoris: Secondary | ICD-10-CM | POA: Diagnosis not present

## 2020-04-21 DIAGNOSIS — I2111 ST elevation (STEMI) myocardial infarction involving right coronary artery: Secondary | ICD-10-CM | POA: Diagnosis not present

## 2020-04-21 DIAGNOSIS — Z955 Presence of coronary angioplasty implant and graft: Secondary | ICD-10-CM | POA: Diagnosis not present

## 2020-04-21 DIAGNOSIS — K219 Gastro-esophageal reflux disease without esophagitis: Secondary | ICD-10-CM | POA: Diagnosis not present

## 2020-04-21 DIAGNOSIS — Z85048 Personal history of other malignant neoplasm of rectum, rectosigmoid junction, and anus: Secondary | ICD-10-CM | POA: Diagnosis not present

## 2020-04-21 DIAGNOSIS — I10 Essential (primary) hypertension: Secondary | ICD-10-CM | POA: Diagnosis not present

## 2020-04-22 DIAGNOSIS — Z720 Tobacco use: Secondary | ICD-10-CM | POA: Diagnosis not present

## 2020-04-22 DIAGNOSIS — E785 Hyperlipidemia, unspecified: Secondary | ICD-10-CM | POA: Diagnosis not present

## 2020-04-22 DIAGNOSIS — Z8673 Personal history of transient ischemic attack (TIA), and cerebral infarction without residual deficits: Secondary | ICD-10-CM | POA: Diagnosis not present

## 2020-04-22 DIAGNOSIS — I10 Essential (primary) hypertension: Secondary | ICD-10-CM | POA: Diagnosis not present

## 2020-04-22 DIAGNOSIS — R079 Chest pain, unspecified: Secondary | ICD-10-CM | POA: Diagnosis not present

## 2020-04-22 DIAGNOSIS — I2510 Atherosclerotic heart disease of native coronary artery without angina pectoris: Secondary | ICD-10-CM | POA: Diagnosis not present

## 2020-04-22 DIAGNOSIS — F17210 Nicotine dependence, cigarettes, uncomplicated: Secondary | ICD-10-CM | POA: Diagnosis not present

## 2020-04-22 DIAGNOSIS — R0602 Shortness of breath: Secondary | ICD-10-CM | POA: Diagnosis not present

## 2020-04-27 DIAGNOSIS — Z79899 Other long term (current) drug therapy: Secondary | ICD-10-CM | POA: Diagnosis not present

## 2020-04-27 DIAGNOSIS — L409 Psoriasis, unspecified: Secondary | ICD-10-CM | POA: Diagnosis not present

## 2020-05-31 DIAGNOSIS — L409 Psoriasis, unspecified: Secondary | ICD-10-CM | POA: Diagnosis not present

## 2020-05-31 DIAGNOSIS — Z79899 Other long term (current) drug therapy: Secondary | ICD-10-CM | POA: Diagnosis not present

## 2020-07-03 DIAGNOSIS — L409 Psoriasis, unspecified: Secondary | ICD-10-CM | POA: Diagnosis not present

## 2020-07-03 DIAGNOSIS — Z79899 Other long term (current) drug therapy: Secondary | ICD-10-CM | POA: Diagnosis not present

## 2020-07-13 DIAGNOSIS — E782 Mixed hyperlipidemia: Secondary | ICD-10-CM | POA: Diagnosis not present

## 2020-07-13 DIAGNOSIS — I10 Essential (primary) hypertension: Secondary | ICD-10-CM | POA: Diagnosis not present

## 2020-07-13 DIAGNOSIS — I2510 Atherosclerotic heart disease of native coronary artery without angina pectoris: Secondary | ICD-10-CM | POA: Diagnosis not present

## 2020-07-13 DIAGNOSIS — F17200 Nicotine dependence, unspecified, uncomplicated: Secondary | ICD-10-CM | POA: Diagnosis not present

## 2020-07-13 DIAGNOSIS — I2111 ST elevation (STEMI) myocardial infarction involving right coronary artery: Secondary | ICD-10-CM | POA: Diagnosis not present

## 2020-07-17 DIAGNOSIS — E782 Mixed hyperlipidemia: Secondary | ICD-10-CM | POA: Diagnosis not present

## 2020-07-17 DIAGNOSIS — I10 Essential (primary) hypertension: Secondary | ICD-10-CM | POA: Diagnosis not present

## 2020-07-17 DIAGNOSIS — R7301 Impaired fasting glucose: Secondary | ICD-10-CM | POA: Diagnosis not present

## 2020-07-17 DIAGNOSIS — E7801 Familial hypercholesterolemia: Secondary | ICD-10-CM | POA: Diagnosis not present

## 2020-07-17 DIAGNOSIS — E7800 Pure hypercholesterolemia, unspecified: Secondary | ICD-10-CM | POA: Diagnosis not present

## 2020-07-17 DIAGNOSIS — E7849 Other hyperlipidemia: Secondary | ICD-10-CM | POA: Diagnosis not present

## 2020-07-25 DIAGNOSIS — I429 Cardiomyopathy, unspecified: Secondary | ICD-10-CM | POA: Diagnosis not present

## 2020-07-25 DIAGNOSIS — E7801 Familial hypercholesterolemia: Secondary | ICD-10-CM | POA: Diagnosis not present

## 2020-07-25 DIAGNOSIS — R7301 Impaired fasting glucose: Secondary | ICD-10-CM | POA: Diagnosis not present

## 2020-07-25 DIAGNOSIS — I639 Cerebral infarction, unspecified: Secondary | ICD-10-CM | POA: Diagnosis not present

## 2020-07-25 DIAGNOSIS — I10 Essential (primary) hypertension: Secondary | ICD-10-CM | POA: Diagnosis not present

## 2020-07-25 DIAGNOSIS — M791 Myalgia, unspecified site: Secondary | ICD-10-CM | POA: Diagnosis not present

## 2020-08-07 DIAGNOSIS — Z79899 Other long term (current) drug therapy: Secondary | ICD-10-CM | POA: Diagnosis not present

## 2020-08-07 DIAGNOSIS — L409 Psoriasis, unspecified: Secondary | ICD-10-CM | POA: Diagnosis not present

## 2020-09-06 DIAGNOSIS — L409 Psoriasis, unspecified: Secondary | ICD-10-CM | POA: Diagnosis not present

## 2020-09-06 DIAGNOSIS — Z79899 Other long term (current) drug therapy: Secondary | ICD-10-CM | POA: Diagnosis not present

## 2020-10-05 DIAGNOSIS — Z79899 Other long term (current) drug therapy: Secondary | ICD-10-CM | POA: Diagnosis not present

## 2020-10-05 DIAGNOSIS — L409 Psoriasis, unspecified: Secondary | ICD-10-CM | POA: Diagnosis not present

## 2020-10-05 DIAGNOSIS — B351 Tinea unguium: Secondary | ICD-10-CM | POA: Diagnosis not present

## 2020-10-12 DIAGNOSIS — B353 Tinea pedis: Secondary | ICD-10-CM | POA: Diagnosis not present

## 2020-10-12 DIAGNOSIS — B351 Tinea unguium: Secondary | ICD-10-CM | POA: Diagnosis not present

## 2020-11-02 DIAGNOSIS — B079 Viral wart, unspecified: Secondary | ICD-10-CM | POA: Diagnosis not present

## 2020-11-02 DIAGNOSIS — L409 Psoriasis, unspecified: Secondary | ICD-10-CM | POA: Diagnosis not present

## 2020-11-02 DIAGNOSIS — B351 Tinea unguium: Secondary | ICD-10-CM | POA: Diagnosis not present

## 2020-11-02 DIAGNOSIS — Z79899 Other long term (current) drug therapy: Secondary | ICD-10-CM | POA: Diagnosis not present

## 2020-11-17 DIAGNOSIS — E782 Mixed hyperlipidemia: Secondary | ICD-10-CM | POA: Diagnosis not present

## 2020-11-17 DIAGNOSIS — I10 Essential (primary) hypertension: Secondary | ICD-10-CM | POA: Diagnosis not present

## 2020-11-17 DIAGNOSIS — E7849 Other hyperlipidemia: Secondary | ICD-10-CM | POA: Diagnosis not present

## 2020-11-17 DIAGNOSIS — R5383 Other fatigue: Secondary | ICD-10-CM | POA: Diagnosis not present

## 2020-11-17 DIAGNOSIS — K2100 Gastro-esophageal reflux disease with esophagitis, without bleeding: Secondary | ICD-10-CM | POA: Diagnosis not present

## 2020-11-28 DIAGNOSIS — I10 Essential (primary) hypertension: Secondary | ICD-10-CM | POA: Diagnosis not present

## 2020-11-28 DIAGNOSIS — R062 Wheezing: Secondary | ICD-10-CM | POA: Diagnosis not present

## 2020-11-28 DIAGNOSIS — I639 Cerebral infarction, unspecified: Secondary | ICD-10-CM | POA: Diagnosis not present

## 2020-11-28 DIAGNOSIS — I429 Cardiomyopathy, unspecified: Secondary | ICD-10-CM | POA: Diagnosis not present

## 2020-11-28 DIAGNOSIS — M791 Myalgia, unspecified site: Secondary | ICD-10-CM | POA: Diagnosis not present

## 2020-11-28 DIAGNOSIS — Z720 Tobacco use: Secondary | ICD-10-CM | POA: Diagnosis not present

## 2020-11-28 DIAGNOSIS — E7801 Familial hypercholesterolemia: Secondary | ICD-10-CM | POA: Diagnosis not present

## 2020-11-29 DIAGNOSIS — B079 Viral wart, unspecified: Secondary | ICD-10-CM | POA: Diagnosis not present

## 2020-11-29 DIAGNOSIS — Z79899 Other long term (current) drug therapy: Secondary | ICD-10-CM | POA: Diagnosis not present

## 2020-11-29 DIAGNOSIS — L409 Psoriasis, unspecified: Secondary | ICD-10-CM | POA: Diagnosis not present

## 2020-11-29 DIAGNOSIS — B351 Tinea unguium: Secondary | ICD-10-CM | POA: Diagnosis not present

## 2020-12-11 DIAGNOSIS — I2510 Atherosclerotic heart disease of native coronary artery without angina pectoris: Secondary | ICD-10-CM | POA: Diagnosis not present

## 2020-12-11 DIAGNOSIS — I10 Essential (primary) hypertension: Secondary | ICD-10-CM | POA: Diagnosis not present

## 2020-12-11 DIAGNOSIS — E782 Mixed hyperlipidemia: Secondary | ICD-10-CM | POA: Diagnosis not present

## 2021-01-01 DIAGNOSIS — L409 Psoriasis, unspecified: Secondary | ICD-10-CM | POA: Diagnosis not present

## 2021-01-01 DIAGNOSIS — Z79899 Other long term (current) drug therapy: Secondary | ICD-10-CM | POA: Diagnosis not present

## 2021-02-07 DIAGNOSIS — Z79899 Other long term (current) drug therapy: Secondary | ICD-10-CM | POA: Diagnosis not present

## 2021-02-07 DIAGNOSIS — L409 Psoriasis, unspecified: Secondary | ICD-10-CM | POA: Diagnosis not present

## 2021-03-08 DIAGNOSIS — L570 Actinic keratosis: Secondary | ICD-10-CM | POA: Diagnosis not present

## 2021-03-30 DIAGNOSIS — K2100 Gastro-esophageal reflux disease with esophagitis, without bleeding: Secondary | ICD-10-CM | POA: Diagnosis not present

## 2021-03-30 DIAGNOSIS — E7849 Other hyperlipidemia: Secondary | ICD-10-CM | POA: Diagnosis not present

## 2021-03-30 DIAGNOSIS — R7301 Impaired fasting glucose: Secondary | ICD-10-CM | POA: Diagnosis not present

## 2021-03-30 DIAGNOSIS — I10 Essential (primary) hypertension: Secondary | ICD-10-CM | POA: Diagnosis not present

## 2021-03-30 DIAGNOSIS — E7800 Pure hypercholesterolemia, unspecified: Secondary | ICD-10-CM | POA: Diagnosis not present

## 2021-03-30 DIAGNOSIS — E782 Mixed hyperlipidemia: Secondary | ICD-10-CM | POA: Diagnosis not present

## 2021-03-30 DIAGNOSIS — E7801 Familial hypercholesterolemia: Secondary | ICD-10-CM | POA: Diagnosis not present

## 2021-04-03 DIAGNOSIS — I429 Cardiomyopathy, unspecified: Secondary | ICD-10-CM | POA: Diagnosis not present

## 2021-04-03 DIAGNOSIS — E7801 Familial hypercholesterolemia: Secondary | ICD-10-CM | POA: Diagnosis not present

## 2021-04-03 DIAGNOSIS — I10 Essential (primary) hypertension: Secondary | ICD-10-CM | POA: Diagnosis not present

## 2021-04-03 DIAGNOSIS — Z7189 Other specified counseling: Secondary | ICD-10-CM | POA: Diagnosis not present

## 2021-04-03 DIAGNOSIS — M791 Myalgia, unspecified site: Secondary | ICD-10-CM | POA: Diagnosis not present

## 2021-04-03 DIAGNOSIS — R062 Wheezing: Secondary | ICD-10-CM | POA: Diagnosis not present

## 2021-04-03 DIAGNOSIS — K2100 Gastro-esophageal reflux disease with esophagitis, without bleeding: Secondary | ICD-10-CM | POA: Diagnosis not present

## 2021-04-03 DIAGNOSIS — I639 Cerebral infarction, unspecified: Secondary | ICD-10-CM | POA: Diagnosis not present

## 2021-04-09 DIAGNOSIS — D409 Neoplasm of uncertain behavior of male genital organ, unspecified: Secondary | ICD-10-CM | POA: Diagnosis not present

## 2021-04-30 DIAGNOSIS — I10 Essential (primary) hypertension: Secondary | ICD-10-CM | POA: Diagnosis not present

## 2021-04-30 DIAGNOSIS — E782 Mixed hyperlipidemia: Secondary | ICD-10-CM | POA: Diagnosis not present

## 2021-04-30 DIAGNOSIS — I2510 Atherosclerotic heart disease of native coronary artery without angina pectoris: Secondary | ICD-10-CM | POA: Diagnosis not present

## 2021-05-14 DIAGNOSIS — L409 Psoriasis, unspecified: Secondary | ICD-10-CM | POA: Diagnosis not present

## 2021-05-14 DIAGNOSIS — Z79899 Other long term (current) drug therapy: Secondary | ICD-10-CM | POA: Diagnosis not present

## 2021-06-13 DIAGNOSIS — L409 Psoriasis, unspecified: Secondary | ICD-10-CM | POA: Diagnosis not present

## 2021-06-13 DIAGNOSIS — Z79899 Other long term (current) drug therapy: Secondary | ICD-10-CM | POA: Diagnosis not present

## 2021-07-13 DIAGNOSIS — J0100 Acute maxillary sinusitis, unspecified: Secondary | ICD-10-CM | POA: Diagnosis not present

## 2021-07-13 DIAGNOSIS — F17210 Nicotine dependence, cigarettes, uncomplicated: Secondary | ICD-10-CM | POA: Diagnosis not present

## 2021-07-13 DIAGNOSIS — R059 Cough, unspecified: Secondary | ICD-10-CM | POA: Diagnosis not present

## 2021-07-18 DIAGNOSIS — L570 Actinic keratosis: Secondary | ICD-10-CM | POA: Diagnosis not present

## 2021-07-18 DIAGNOSIS — L409 Psoriasis, unspecified: Secondary | ICD-10-CM | POA: Diagnosis not present

## 2021-07-18 DIAGNOSIS — Z1283 Encounter for screening for malignant neoplasm of skin: Secondary | ICD-10-CM | POA: Diagnosis not present

## 2021-07-18 DIAGNOSIS — B356 Tinea cruris: Secondary | ICD-10-CM | POA: Diagnosis not present

## 2021-07-27 DIAGNOSIS — H35711 Central serous chorioretinopathy, right eye: Secondary | ICD-10-CM | POA: Diagnosis not present

## 2021-08-01 DIAGNOSIS — H35711 Central serous chorioretinopathy, right eye: Secondary | ICD-10-CM | POA: Diagnosis not present

## 2021-08-16 DIAGNOSIS — L409 Psoriasis, unspecified: Secondary | ICD-10-CM | POA: Diagnosis not present

## 2021-08-16 DIAGNOSIS — Z79899 Other long term (current) drug therapy: Secondary | ICD-10-CM | POA: Diagnosis not present

## 2021-09-12 DIAGNOSIS — H35711 Central serous chorioretinopathy, right eye: Secondary | ICD-10-CM | POA: Diagnosis not present

## 2021-09-12 DIAGNOSIS — H2513 Age-related nuclear cataract, bilateral: Secondary | ICD-10-CM | POA: Diagnosis not present

## 2021-09-13 DIAGNOSIS — L409 Psoriasis, unspecified: Secondary | ICD-10-CM | POA: Diagnosis not present

## 2021-10-09 DIAGNOSIS — E782 Mixed hyperlipidemia: Secondary | ICD-10-CM | POA: Diagnosis not present

## 2021-10-09 DIAGNOSIS — E7849 Other hyperlipidemia: Secondary | ICD-10-CM | POA: Diagnosis not present

## 2021-10-09 DIAGNOSIS — K2100 Gastro-esophageal reflux disease with esophagitis, without bleeding: Secondary | ICD-10-CM | POA: Diagnosis not present

## 2021-10-09 DIAGNOSIS — I10 Essential (primary) hypertension: Secondary | ICD-10-CM | POA: Diagnosis not present

## 2021-10-09 DIAGNOSIS — E7800 Pure hypercholesterolemia, unspecified: Secondary | ICD-10-CM | POA: Diagnosis not present

## 2021-10-09 DIAGNOSIS — R7301 Impaired fasting glucose: Secondary | ICD-10-CM | POA: Diagnosis not present

## 2021-10-09 DIAGNOSIS — E7801 Familial hypercholesterolemia: Secondary | ICD-10-CM | POA: Diagnosis not present

## 2021-10-11 DIAGNOSIS — D1801 Hemangioma of skin and subcutaneous tissue: Secondary | ICD-10-CM | POA: Diagnosis not present

## 2021-10-11 DIAGNOSIS — D1720 Benign lipomatous neoplasm of skin and subcutaneous tissue of unspecified limb: Secondary | ICD-10-CM | POA: Diagnosis not present

## 2021-10-11 DIAGNOSIS — Z79899 Other long term (current) drug therapy: Secondary | ICD-10-CM | POA: Diagnosis not present

## 2021-10-11 DIAGNOSIS — L409 Psoriasis, unspecified: Secondary | ICD-10-CM | POA: Diagnosis not present

## 2021-10-16 DIAGNOSIS — Z720 Tobacco use: Secondary | ICD-10-CM | POA: Diagnosis not present

## 2021-10-16 DIAGNOSIS — I6930 Unspecified sequelae of cerebral infarction: Secondary | ICD-10-CM | POA: Diagnosis not present

## 2021-10-16 DIAGNOSIS — E7801 Familial hypercholesterolemia: Secondary | ICD-10-CM | POA: Diagnosis not present

## 2021-10-16 DIAGNOSIS — I10 Essential (primary) hypertension: Secondary | ICD-10-CM | POA: Diagnosis not present

## 2021-10-16 DIAGNOSIS — R062 Wheezing: Secondary | ICD-10-CM | POA: Diagnosis not present

## 2021-10-16 DIAGNOSIS — R7301 Impaired fasting glucose: Secondary | ICD-10-CM | POA: Diagnosis not present

## 2021-10-16 DIAGNOSIS — I429 Cardiomyopathy, unspecified: Secondary | ICD-10-CM | POA: Diagnosis not present

## 2021-10-16 DIAGNOSIS — M791 Myalgia, unspecified site: Secondary | ICD-10-CM | POA: Diagnosis not present

## 2021-10-29 DIAGNOSIS — I10 Essential (primary) hypertension: Secondary | ICD-10-CM | POA: Diagnosis not present

## 2021-10-29 DIAGNOSIS — E782 Mixed hyperlipidemia: Secondary | ICD-10-CM | POA: Diagnosis not present

## 2021-10-29 DIAGNOSIS — I2510 Atherosclerotic heart disease of native coronary artery without angina pectoris: Secondary | ICD-10-CM | POA: Diagnosis not present

## 2021-11-05 DIAGNOSIS — L409 Psoriasis, unspecified: Secondary | ICD-10-CM | POA: Diagnosis not present

## 2021-12-06 DIAGNOSIS — L409 Psoriasis, unspecified: Secondary | ICD-10-CM | POA: Diagnosis not present

## 2022-01-03 DIAGNOSIS — L409 Psoriasis, unspecified: Secondary | ICD-10-CM | POA: Diagnosis not present

## 2022-01-03 DIAGNOSIS — B3783 Candidal cheilitis: Secondary | ICD-10-CM | POA: Diagnosis not present

## 2022-01-03 DIAGNOSIS — B354 Tinea corporis: Secondary | ICD-10-CM | POA: Diagnosis not present

## 2022-01-31 DIAGNOSIS — B356 Tinea cruris: Secondary | ICD-10-CM | POA: Diagnosis not present

## 2022-01-31 DIAGNOSIS — L409 Psoriasis, unspecified: Secondary | ICD-10-CM | POA: Diagnosis not present

## 2022-02-20 DIAGNOSIS — H35711 Central serous chorioretinopathy, right eye: Secondary | ICD-10-CM | POA: Diagnosis not present

## 2022-02-20 DIAGNOSIS — H2513 Age-related nuclear cataract, bilateral: Secondary | ICD-10-CM | POA: Diagnosis not present

## 2022-02-28 DIAGNOSIS — Z79899 Other long term (current) drug therapy: Secondary | ICD-10-CM | POA: Diagnosis not present

## 2022-02-28 DIAGNOSIS — L409 Psoriasis, unspecified: Secondary | ICD-10-CM | POA: Diagnosis not present

## 2022-03-28 DIAGNOSIS — Z79899 Other long term (current) drug therapy: Secondary | ICD-10-CM | POA: Diagnosis not present

## 2022-03-28 DIAGNOSIS — L409 Psoriasis, unspecified: Secondary | ICD-10-CM | POA: Diagnosis not present

## 2022-03-28 DIAGNOSIS — L309 Dermatitis, unspecified: Secondary | ICD-10-CM | POA: Diagnosis not present

## 2022-03-28 DIAGNOSIS — L723 Sebaceous cyst: Secondary | ICD-10-CM | POA: Diagnosis not present

## 2022-04-11 DIAGNOSIS — L723 Sebaceous cyst: Secondary | ICD-10-CM | POA: Diagnosis not present

## 2022-04-11 DIAGNOSIS — L02214 Cutaneous abscess of groin: Secondary | ICD-10-CM | POA: Diagnosis not present

## 2022-04-15 DIAGNOSIS — R7301 Impaired fasting glucose: Secondary | ICD-10-CM | POA: Diagnosis not present

## 2022-04-15 DIAGNOSIS — K2100 Gastro-esophageal reflux disease with esophagitis, without bleeding: Secondary | ICD-10-CM | POA: Diagnosis not present

## 2022-04-15 DIAGNOSIS — E7849 Other hyperlipidemia: Secondary | ICD-10-CM | POA: Diagnosis not present

## 2022-04-15 DIAGNOSIS — E7801 Familial hypercholesterolemia: Secondary | ICD-10-CM | POA: Diagnosis not present

## 2022-04-18 DIAGNOSIS — Z6826 Body mass index (BMI) 26.0-26.9, adult: Secondary | ICD-10-CM | POA: Diagnosis not present

## 2022-04-18 DIAGNOSIS — R062 Wheezing: Secondary | ICD-10-CM | POA: Diagnosis not present

## 2022-04-18 DIAGNOSIS — I6930 Unspecified sequelae of cerebral infarction: Secondary | ICD-10-CM | POA: Diagnosis not present

## 2022-04-18 DIAGNOSIS — I429 Cardiomyopathy, unspecified: Secondary | ICD-10-CM | POA: Diagnosis not present

## 2022-04-18 DIAGNOSIS — I10 Essential (primary) hypertension: Secondary | ICD-10-CM | POA: Diagnosis not present

## 2022-04-18 DIAGNOSIS — R7301 Impaired fasting glucose: Secondary | ICD-10-CM | POA: Diagnosis not present

## 2022-04-18 DIAGNOSIS — K2100 Gastro-esophageal reflux disease with esophagitis, without bleeding: Secondary | ICD-10-CM | POA: Diagnosis not present

## 2022-04-18 DIAGNOSIS — R7303 Prediabetes: Secondary | ICD-10-CM | POA: Diagnosis not present

## 2022-04-18 DIAGNOSIS — M791 Myalgia, unspecified site: Secondary | ICD-10-CM | POA: Diagnosis not present

## 2022-04-18 DIAGNOSIS — Z720 Tobacco use: Secondary | ICD-10-CM | POA: Diagnosis not present

## 2022-04-18 DIAGNOSIS — E7849 Other hyperlipidemia: Secondary | ICD-10-CM | POA: Diagnosis not present

## 2022-04-18 DIAGNOSIS — L409 Psoriasis, unspecified: Secondary | ICD-10-CM | POA: Diagnosis not present

## 2022-04-25 DIAGNOSIS — L409 Psoriasis, unspecified: Secondary | ICD-10-CM | POA: Diagnosis not present

## 2022-04-25 DIAGNOSIS — Z79899 Other long term (current) drug therapy: Secondary | ICD-10-CM | POA: Diagnosis not present

## 2022-04-30 DIAGNOSIS — I2510 Atherosclerotic heart disease of native coronary artery without angina pectoris: Secondary | ICD-10-CM | POA: Diagnosis not present

## 2022-04-30 DIAGNOSIS — I10 Essential (primary) hypertension: Secondary | ICD-10-CM | POA: Diagnosis not present

## 2022-04-30 DIAGNOSIS — E782 Mixed hyperlipidemia: Secondary | ICD-10-CM | POA: Diagnosis not present

## 2022-05-23 DIAGNOSIS — L409 Psoriasis, unspecified: Secondary | ICD-10-CM | POA: Diagnosis not present

## 2022-05-23 DIAGNOSIS — Z79899 Other long term (current) drug therapy: Secondary | ICD-10-CM | POA: Diagnosis not present

## 2022-06-19 DIAGNOSIS — L409 Psoriasis, unspecified: Secondary | ICD-10-CM | POA: Diagnosis not present

## 2022-06-19 DIAGNOSIS — Z79899 Other long term (current) drug therapy: Secondary | ICD-10-CM | POA: Diagnosis not present

## 2022-07-18 DIAGNOSIS — Z79899 Other long term (current) drug therapy: Secondary | ICD-10-CM | POA: Diagnosis not present

## 2022-07-18 DIAGNOSIS — L409 Psoriasis, unspecified: Secondary | ICD-10-CM | POA: Diagnosis not present

## 2022-08-14 DIAGNOSIS — F17210 Nicotine dependence, cigarettes, uncomplicated: Secondary | ICD-10-CM | POA: Diagnosis not present

## 2022-08-14 DIAGNOSIS — K047 Periapical abscess without sinus: Secondary | ICD-10-CM | POA: Diagnosis not present

## 2022-08-14 DIAGNOSIS — R030 Elevated blood-pressure reading, without diagnosis of hypertension: Secondary | ICD-10-CM | POA: Diagnosis not present

## 2022-08-14 DIAGNOSIS — Z6826 Body mass index (BMI) 26.0-26.9, adult: Secondary | ICD-10-CM | POA: Diagnosis not present

## 2022-08-15 DIAGNOSIS — Z79899 Other long term (current) drug therapy: Secondary | ICD-10-CM | POA: Diagnosis not present

## 2022-08-15 DIAGNOSIS — L409 Psoriasis, unspecified: Secondary | ICD-10-CM | POA: Diagnosis not present

## 2022-09-12 DIAGNOSIS — Z79899 Other long term (current) drug therapy: Secondary | ICD-10-CM | POA: Diagnosis not present

## 2022-09-12 DIAGNOSIS — L409 Psoriasis, unspecified: Secondary | ICD-10-CM | POA: Diagnosis not present

## 2022-09-13 DIAGNOSIS — H35711 Central serous chorioretinopathy, right eye: Secondary | ICD-10-CM | POA: Diagnosis not present

## 2022-10-08 DIAGNOSIS — E7849 Other hyperlipidemia: Secondary | ICD-10-CM | POA: Diagnosis not present

## 2022-10-08 DIAGNOSIS — R7301 Impaired fasting glucose: Secondary | ICD-10-CM | POA: Diagnosis not present

## 2022-10-08 DIAGNOSIS — I10 Essential (primary) hypertension: Secondary | ICD-10-CM | POA: Diagnosis not present

## 2022-10-08 DIAGNOSIS — K2100 Gastro-esophageal reflux disease with esophagitis, without bleeding: Secondary | ICD-10-CM | POA: Diagnosis not present

## 2022-10-10 DIAGNOSIS — L409 Psoriasis, unspecified: Secondary | ICD-10-CM | POA: Diagnosis not present

## 2022-10-10 DIAGNOSIS — Z79899 Other long term (current) drug therapy: Secondary | ICD-10-CM | POA: Diagnosis not present

## 2022-10-15 DIAGNOSIS — R7303 Prediabetes: Secondary | ICD-10-CM | POA: Diagnosis not present

## 2022-10-15 DIAGNOSIS — R062 Wheezing: Secondary | ICD-10-CM | POA: Diagnosis not present

## 2022-10-15 DIAGNOSIS — K2100 Gastro-esophageal reflux disease with esophagitis, without bleeding: Secondary | ICD-10-CM | POA: Diagnosis not present

## 2022-10-15 DIAGNOSIS — I429 Cardiomyopathy, unspecified: Secondary | ICD-10-CM | POA: Diagnosis not present

## 2022-10-15 DIAGNOSIS — Z720 Tobacco use: Secondary | ICD-10-CM | POA: Diagnosis not present

## 2022-10-15 DIAGNOSIS — Z6825 Body mass index (BMI) 25.0-25.9, adult: Secondary | ICD-10-CM | POA: Diagnosis not present

## 2022-10-15 DIAGNOSIS — L409 Psoriasis, unspecified: Secondary | ICD-10-CM | POA: Diagnosis not present

## 2022-10-15 DIAGNOSIS — E7801 Familial hypercholesterolemia: Secondary | ICD-10-CM | POA: Diagnosis not present

## 2022-10-15 DIAGNOSIS — I10 Essential (primary) hypertension: Secondary | ICD-10-CM | POA: Diagnosis not present

## 2022-10-15 DIAGNOSIS — M791 Myalgia, unspecified site: Secondary | ICD-10-CM | POA: Diagnosis not present

## 2022-10-15 DIAGNOSIS — I6930 Unspecified sequelae of cerebral infarction: Secondary | ICD-10-CM | POA: Diagnosis not present

## 2022-10-15 DIAGNOSIS — R7301 Impaired fasting glucose: Secondary | ICD-10-CM | POA: Diagnosis not present

## 2022-10-30 DIAGNOSIS — E782 Mixed hyperlipidemia: Secondary | ICD-10-CM | POA: Diagnosis not present

## 2022-10-30 DIAGNOSIS — I2510 Atherosclerotic heart disease of native coronary artery without angina pectoris: Secondary | ICD-10-CM | POA: Diagnosis not present

## 2022-10-30 DIAGNOSIS — I10 Essential (primary) hypertension: Secondary | ICD-10-CM | POA: Diagnosis not present

## 2022-11-13 DIAGNOSIS — L409 Psoriasis, unspecified: Secondary | ICD-10-CM | POA: Diagnosis not present

## 2022-11-13 DIAGNOSIS — Z79899 Other long term (current) drug therapy: Secondary | ICD-10-CM | POA: Diagnosis not present

## 2022-12-12 DIAGNOSIS — Z79899 Other long term (current) drug therapy: Secondary | ICD-10-CM | POA: Diagnosis not present

## 2022-12-12 DIAGNOSIS — L409 Psoriasis, unspecified: Secondary | ICD-10-CM | POA: Diagnosis not present

## 2023-01-08 DIAGNOSIS — L409 Psoriasis, unspecified: Secondary | ICD-10-CM | POA: Diagnosis not present

## 2023-01-08 DIAGNOSIS — D485 Neoplasm of uncertain behavior of skin: Secondary | ICD-10-CM | POA: Diagnosis not present

## 2023-01-08 DIAGNOSIS — D1730 Benign lipomatous neoplasm of skin and subcutaneous tissue of unspecified sites: Secondary | ICD-10-CM | POA: Diagnosis not present

## 2023-01-08 DIAGNOSIS — L570 Actinic keratosis: Secondary | ICD-10-CM | POA: Diagnosis not present

## 2023-02-06 DIAGNOSIS — D1730 Benign lipomatous neoplasm of skin and subcutaneous tissue of unspecified sites: Secondary | ICD-10-CM | POA: Diagnosis not present

## 2023-02-06 DIAGNOSIS — L409 Psoriasis, unspecified: Secondary | ICD-10-CM | POA: Diagnosis not present

## 2023-03-06 DIAGNOSIS — L409 Psoriasis, unspecified: Secondary | ICD-10-CM | POA: Diagnosis not present

## 2023-03-06 DIAGNOSIS — D1730 Benign lipomatous neoplasm of skin and subcutaneous tissue of unspecified sites: Secondary | ICD-10-CM | POA: Diagnosis not present

## 2023-03-06 DIAGNOSIS — Z79899 Other long term (current) drug therapy: Secondary | ICD-10-CM | POA: Diagnosis not present

## 2023-04-03 DIAGNOSIS — Z79899 Other long term (current) drug therapy: Secondary | ICD-10-CM | POA: Diagnosis not present

## 2023-04-03 DIAGNOSIS — L400 Psoriasis vulgaris: Secondary | ICD-10-CM | POA: Diagnosis not present

## 2023-04-07 DIAGNOSIS — R7303 Prediabetes: Secondary | ICD-10-CM | POA: Diagnosis not present

## 2023-04-07 DIAGNOSIS — I10 Essential (primary) hypertension: Secondary | ICD-10-CM | POA: Diagnosis not present

## 2023-04-07 DIAGNOSIS — E7849 Other hyperlipidemia: Secondary | ICD-10-CM | POA: Diagnosis not present

## 2023-04-07 DIAGNOSIS — E7801 Familial hypercholesterolemia: Secondary | ICD-10-CM | POA: Diagnosis not present

## 2023-04-14 DIAGNOSIS — I429 Cardiomyopathy, unspecified: Secondary | ICD-10-CM | POA: Diagnosis not present

## 2023-04-14 DIAGNOSIS — M7551 Bursitis of right shoulder: Secondary | ICD-10-CM | POA: Diagnosis not present

## 2023-04-14 DIAGNOSIS — E7801 Familial hypercholesterolemia: Secondary | ICD-10-CM | POA: Diagnosis not present

## 2023-04-14 DIAGNOSIS — Z720 Tobacco use: Secondary | ICD-10-CM | POA: Diagnosis not present

## 2023-04-14 DIAGNOSIS — I10 Essential (primary) hypertension: Secondary | ICD-10-CM | POA: Diagnosis not present

## 2023-04-14 DIAGNOSIS — R7303 Prediabetes: Secondary | ICD-10-CM | POA: Diagnosis not present

## 2023-04-14 DIAGNOSIS — K2100 Gastro-esophageal reflux disease with esophagitis, without bleeding: Secondary | ICD-10-CM | POA: Diagnosis not present

## 2023-04-14 DIAGNOSIS — L409 Psoriasis, unspecified: Secondary | ICD-10-CM | POA: Diagnosis not present

## 2023-04-14 DIAGNOSIS — I6930 Unspecified sequelae of cerebral infarction: Secondary | ICD-10-CM | POA: Diagnosis not present

## 2023-04-14 DIAGNOSIS — R062 Wheezing: Secondary | ICD-10-CM | POA: Diagnosis not present

## 2023-04-14 DIAGNOSIS — R7301 Impaired fasting glucose: Secondary | ICD-10-CM | POA: Diagnosis not present

## 2023-04-14 DIAGNOSIS — M791 Myalgia, unspecified site: Secondary | ICD-10-CM | POA: Diagnosis not present

## 2023-05-01 DIAGNOSIS — I2510 Atherosclerotic heart disease of native coronary artery without angina pectoris: Secondary | ICD-10-CM | POA: Diagnosis not present

## 2023-05-01 DIAGNOSIS — I10 Essential (primary) hypertension: Secondary | ICD-10-CM | POA: Diagnosis not present

## 2023-05-01 DIAGNOSIS — L400 Psoriasis vulgaris: Secondary | ICD-10-CM | POA: Diagnosis not present

## 2023-05-01 DIAGNOSIS — E782 Mixed hyperlipidemia: Secondary | ICD-10-CM | POA: Diagnosis not present

## 2023-05-01 DIAGNOSIS — Z79899 Other long term (current) drug therapy: Secondary | ICD-10-CM | POA: Diagnosis not present

## 2023-05-01 DIAGNOSIS — I2111 ST elevation (STEMI) myocardial infarction involving right coronary artery: Secondary | ICD-10-CM | POA: Diagnosis not present

## 2023-05-29 DIAGNOSIS — L400 Psoriasis vulgaris: Secondary | ICD-10-CM | POA: Diagnosis not present

## 2023-06-17 DIAGNOSIS — Z2089 Contact with and (suspected) exposure to other communicable diseases: Secondary | ICD-10-CM | POA: Diagnosis not present

## 2023-06-17 DIAGNOSIS — Z20828 Contact with and (suspected) exposure to other viral communicable diseases: Secondary | ICD-10-CM | POA: Diagnosis not present

## 2023-06-17 DIAGNOSIS — J09X2 Influenza due to identified novel influenza A virus with other respiratory manifestations: Secondary | ICD-10-CM | POA: Diagnosis not present

## 2023-06-17 DIAGNOSIS — Z6826 Body mass index (BMI) 26.0-26.9, adult: Secondary | ICD-10-CM | POA: Diagnosis not present

## 2023-07-21 DIAGNOSIS — Z79899 Other long term (current) drug therapy: Secondary | ICD-10-CM | POA: Diagnosis not present

## 2023-07-21 DIAGNOSIS — L400 Psoriasis vulgaris: Secondary | ICD-10-CM | POA: Diagnosis not present

## 2023-07-21 DIAGNOSIS — L738 Other specified follicular disorders: Secondary | ICD-10-CM | POA: Diagnosis not present

## 2023-07-21 DIAGNOSIS — I781 Nevus, non-neoplastic: Secondary | ICD-10-CM | POA: Diagnosis not present

## 2023-08-21 DIAGNOSIS — L570 Actinic keratosis: Secondary | ICD-10-CM | POA: Diagnosis not present

## 2023-08-21 DIAGNOSIS — L409 Psoriasis, unspecified: Secondary | ICD-10-CM | POA: Diagnosis not present

## 2023-09-04 DIAGNOSIS — D1801 Hemangioma of skin and subcutaneous tissue: Secondary | ICD-10-CM | POA: Diagnosis not present

## 2023-09-04 DIAGNOSIS — D485 Neoplasm of uncertain behavior of skin: Secondary | ICD-10-CM | POA: Diagnosis not present

## 2023-09-22 DIAGNOSIS — L304 Erythema intertrigo: Secondary | ICD-10-CM | POA: Diagnosis not present

## 2023-09-22 DIAGNOSIS — L400 Psoriasis vulgaris: Secondary | ICD-10-CM | POA: Diagnosis not present

## 2023-09-22 DIAGNOSIS — L0100 Impetigo, unspecified: Secondary | ICD-10-CM | POA: Diagnosis not present

## 2023-10-07 DIAGNOSIS — E782 Mixed hyperlipidemia: Secondary | ICD-10-CM | POA: Diagnosis not present

## 2023-10-07 DIAGNOSIS — E7849 Other hyperlipidemia: Secondary | ICD-10-CM | POA: Diagnosis not present

## 2023-10-07 DIAGNOSIS — I10 Essential (primary) hypertension: Secondary | ICD-10-CM | POA: Diagnosis not present

## 2023-10-07 DIAGNOSIS — R7303 Prediabetes: Secondary | ICD-10-CM | POA: Diagnosis not present

## 2023-10-07 DIAGNOSIS — R42 Dizziness and giddiness: Secondary | ICD-10-CM | POA: Diagnosis not present

## 2023-10-14 DIAGNOSIS — Z6825 Body mass index (BMI) 25.0-25.9, adult: Secondary | ICD-10-CM | POA: Diagnosis not present

## 2023-10-14 DIAGNOSIS — E782 Mixed hyperlipidemia: Secondary | ICD-10-CM | POA: Diagnosis not present

## 2023-10-14 DIAGNOSIS — I69398 Other sequelae of cerebral infarction: Secondary | ICD-10-CM | POA: Diagnosis not present

## 2023-10-14 DIAGNOSIS — I10 Essential (primary) hypertension: Secondary | ICD-10-CM | POA: Diagnosis not present

## 2023-10-14 DIAGNOSIS — R7303 Prediabetes: Secondary | ICD-10-CM | POA: Diagnosis not present

## 2023-10-14 DIAGNOSIS — E7849 Other hyperlipidemia: Secondary | ICD-10-CM | POA: Diagnosis not present

## 2023-10-16 DIAGNOSIS — Z79899 Other long term (current) drug therapy: Secondary | ICD-10-CM | POA: Diagnosis not present

## 2023-10-16 DIAGNOSIS — L400 Psoriasis vulgaris: Secondary | ICD-10-CM | POA: Diagnosis not present

## 2023-10-16 DIAGNOSIS — L304 Erythema intertrigo: Secondary | ICD-10-CM | POA: Diagnosis not present

## 2023-10-23 DIAGNOSIS — I2510 Atherosclerotic heart disease of native coronary artery without angina pectoris: Secondary | ICD-10-CM | POA: Diagnosis not present

## 2023-10-23 DIAGNOSIS — I10 Essential (primary) hypertension: Secondary | ICD-10-CM | POA: Diagnosis not present

## 2023-10-23 DIAGNOSIS — E782 Mixed hyperlipidemia: Secondary | ICD-10-CM | POA: Diagnosis not present

## 2023-10-23 DIAGNOSIS — I2111 ST elevation (STEMI) myocardial infarction involving right coronary artery: Secondary | ICD-10-CM | POA: Diagnosis not present

## 2023-10-23 DIAGNOSIS — R0609 Other forms of dyspnea: Secondary | ICD-10-CM | POA: Diagnosis not present

## 2023-11-12 DIAGNOSIS — L040 Acute lymphadenitis of face, head and neck: Secondary | ICD-10-CM | POA: Diagnosis not present

## 2023-11-12 DIAGNOSIS — L304 Erythema intertrigo: Secondary | ICD-10-CM | POA: Diagnosis not present

## 2023-12-11 DIAGNOSIS — L0100 Impetigo, unspecified: Secondary | ICD-10-CM | POA: Diagnosis not present

## 2023-12-11 DIAGNOSIS — Z79899 Other long term (current) drug therapy: Secondary | ICD-10-CM | POA: Diagnosis not present

## 2023-12-11 DIAGNOSIS — L304 Erythema intertrigo: Secondary | ICD-10-CM | POA: Diagnosis not present

## 2023-12-11 DIAGNOSIS — L400 Psoriasis vulgaris: Secondary | ICD-10-CM | POA: Diagnosis not present

## 2024-01-08 DIAGNOSIS — L821 Other seborrheic keratosis: Secondary | ICD-10-CM | POA: Diagnosis not present

## 2024-01-08 DIAGNOSIS — Z79899 Other long term (current) drug therapy: Secondary | ICD-10-CM | POA: Diagnosis not present

## 2024-01-08 DIAGNOSIS — L400 Psoriasis vulgaris: Secondary | ICD-10-CM | POA: Diagnosis not present

## 2024-02-05 DIAGNOSIS — L400 Psoriasis vulgaris: Secondary | ICD-10-CM | POA: Diagnosis not present

## 2024-02-05 DIAGNOSIS — B079 Viral wart, unspecified: Secondary | ICD-10-CM | POA: Diagnosis not present

## 2024-02-05 DIAGNOSIS — Z79899 Other long term (current) drug therapy: Secondary | ICD-10-CM | POA: Diagnosis not present

## 2024-02-19 DIAGNOSIS — L400 Psoriasis vulgaris: Secondary | ICD-10-CM | POA: Diagnosis not present

## 2024-02-19 DIAGNOSIS — L03115 Cellulitis of right lower limb: Secondary | ICD-10-CM | POA: Diagnosis not present

## 2024-03-04 DIAGNOSIS — L400 Psoriasis vulgaris: Secondary | ICD-10-CM | POA: Diagnosis not present

## 2024-03-04 DIAGNOSIS — L280 Lichen simplex chronicus: Secondary | ICD-10-CM | POA: Diagnosis not present

## 2024-03-04 DIAGNOSIS — Z79899 Other long term (current) drug therapy: Secondary | ICD-10-CM | POA: Diagnosis not present

## 2024-03-04 DIAGNOSIS — D485 Neoplasm of uncertain behavior of skin: Secondary | ICD-10-CM | POA: Diagnosis not present

## 2024-04-01 DIAGNOSIS — L400 Psoriasis vulgaris: Secondary | ICD-10-CM | POA: Diagnosis not present

## 2024-04-01 DIAGNOSIS — L280 Lichen simplex chronicus: Secondary | ICD-10-CM | POA: Diagnosis not present

## 2024-04-01 DIAGNOSIS — Z79899 Other long term (current) drug therapy: Secondary | ICD-10-CM | POA: Diagnosis not present
